# Patient Record
Sex: Female | Born: 2000
Health system: Southern US, Community
[De-identification: ages and names within clinical notes are randomized; demographics above are authoritative.]

## PROBLEM LIST (undated history)

## (undated) DIAGNOSIS — H7290 Unspecified perforation of tympanic membrane, unspecified ear: Secondary | ICD-10-CM

## (undated) DIAGNOSIS — Z8709 Personal history of other diseases of the respiratory system: Secondary | ICD-10-CM

## (undated) DIAGNOSIS — J302 Other seasonal allergic rhinitis: Secondary | ICD-10-CM

## (undated) HISTORY — PX: TONSILLECTOMY: SUR1361

## (undated) HISTORY — PX: ADENOIDECTOMY: SUR15

## (undated) HISTORY — PX: TYMPANOSTOMY TUBE PLACEMENT: SHX32

---
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
## 2020-01-17 DIAGNOSIS — W540XXA Bitten by dog, initial encounter: Secondary | ICD-10-CM | POA: Diagnosis not present

## 2020-01-17 DIAGNOSIS — S61451A Open bite of right hand, initial encounter: Secondary | ICD-10-CM | POA: Diagnosis not present

## 2020-03-02 DIAGNOSIS — J0100 Acute maxillary sinusitis, unspecified: Secondary | ICD-10-CM | POA: Diagnosis not present

## 2020-03-24 DIAGNOSIS — J330 Polyp of nasal cavity: Secondary | ICD-10-CM | POA: Diagnosis not present

## 2020-03-24 DIAGNOSIS — J309 Allergic rhinitis, unspecified: Secondary | ICD-10-CM | POA: Diagnosis not present

## 2020-04-21 DIAGNOSIS — J3081 Allergic rhinitis due to animal (cat) (dog) hair and dander: Secondary | ICD-10-CM | POA: Diagnosis not present

## 2020-04-21 DIAGNOSIS — J338 Other polyp of sinus: Secondary | ICD-10-CM | POA: Diagnosis not present

## 2020-04-21 DIAGNOSIS — J3089 Other allergic rhinitis: Secondary | ICD-10-CM | POA: Diagnosis not present

## 2020-04-21 DIAGNOSIS — J301 Allergic rhinitis due to pollen: Secondary | ICD-10-CM | POA: Diagnosis not present

## 2020-04-21 MED FILL — MONTELUKAST SOD 10 MG TAB: 10 | 30 days supply | Qty: 30 | Fill #0

## 2020-09-11 ENCOUNTER — Institutional Professional Consult (permissible substitution): Payer: 59 | Admitting: Plastic Surgery

## 2020-11-19 DIAGNOSIS — J3089 Other allergic rhinitis: Secondary | ICD-10-CM | POA: Diagnosis not present

## 2020-11-19 DIAGNOSIS — J301 Allergic rhinitis due to pollen: Secondary | ICD-10-CM | POA: Diagnosis not present

## 2020-11-19 DIAGNOSIS — J338 Other polyp of sinus: Secondary | ICD-10-CM | POA: Diagnosis not present

## 2020-11-19 DIAGNOSIS — J3081 Allergic rhinitis due to animal (cat) (dog) hair and dander: Secondary | ICD-10-CM | POA: Diagnosis not present

## 2021-09-14 ENCOUNTER — Encounter: Payer: Self-pay | Admitting: Family Medicine

## 2021-09-14 ENCOUNTER — Ambulatory Visit (INDEPENDENT_AMBULATORY_CARE_PROVIDER_SITE_OTHER): Payer: No Typology Code available for payment source | Admitting: Family Medicine

## 2021-09-14 VITALS — BP 130/71 | HR 101 | Ht 63.0 in | Wt 164.8 lb

## 2021-09-14 DIAGNOSIS — F419 Anxiety disorder, unspecified: Secondary | ICD-10-CM | POA: Diagnosis not present

## 2021-09-14 DIAGNOSIS — F32A Depression, unspecified: Secondary | ICD-10-CM | POA: Diagnosis not present

## 2021-09-14 MED ORDER — HYDROXYZINE PAMOATE 25 MG PO CAPS
25.0000 mg | ORAL_CAPSULE | Freq: Three times a day (TID) | ORAL | 0 refills | Status: DC | PRN
  Filled 2021-09-16: qty 30, 10d supply, fill #0

## 2021-09-14 MED ORDER — SERTRALINE HCL 50 MG PO TABS
50.0000 mg | ORAL_TABLET | Freq: Every day | ORAL | 3 refills | Status: DC
  Filled 2021-09-16: qty 30, 30d supply, fill #0
  Filled 2021-10-16: qty 30, 30d supply, fill #1

## 2021-09-14 NOTE — Progress Notes (Signed)
? ?______________________________________________________________________ ? ?HPI ?Annette Baker is a 21 y.o. female presenting to [facility] today to establish care. She is working in [employer] and in school for marketing at [employer].  ? ?Patient Care Team: ?Clayborne Dana, NP as PCP - General (Family Medicine) ? ?Health Maintenance  ?Topic Date Due  ? HPV Vaccine (1 - 2-dose series) Never done  ? HIV Screening  Never done  ? Hepatitis C Screening: USPSTF Recommendation to screen - Ages 46-79 yo.  Never done  ? COVID-19 Vaccine (4 - Booster for Pfizer series) 06/16/2020  ? Flu Shot  Never done  ? Pap Smear  08/04/2021  ? Pap Smear  08/04/2021  ? Tetanus Vaccine  02/21/2022  ? ? ? ?Concerns today: ?Depression/Anxiety - has been dealing with it for awhile, never addressed it with providers in the past, wants to address now. No SI/HI or suicide attempts since high school, several years ago. Unsure of any triggers initially, but brother died a few years ago (special needs) and that has worsened anxiety/depression. Never used medications, counseling. Very rare panic/anxiety attacks, otherwise just baseline anxiety/depression. Feels like she has a good support system.  ? ?Patient Active Problem List  ? Diagnosis Date Noted  ? Tympanic membrane central perforation 10/13/2012  ? Otitis media 08/15/2012  ? Asthma 05/21/2012  ? ? ?PHQ9 Today: ?Depression screen [facility] 2/9 09/14/2021  ?Decreased Interest 2  ?Down, Depressed, Hopeless 2  ?PHQ - 2 Score 4  ?Altered sleeping 3  ?Tired, decreased energy 3  ?Change in appetite 3  ?Feeling bad or failure about yourself  2  ?Trouble concentrating 3  ?Moving slowly or fidgety/restless 3  ?Suicidal thoughts 0  ?PHQ-9 Score 21  ?Difficult doing work/chores Somewhat difficult  ? ?GAD7 Today: ?GAD 7 : Generalized Anxiety Score 09/14/2021  ?Nervous, Anxious, on Edge 3  ?Control/stop worrying 2  ?Worry too much - different things 2  ?Trouble relaxing 2   ?Restless 3  ?Easily annoyed or irritable 3  ?Afraid - awful might happen 2  ?Total GAD 7 Score 17  ?Anxiety Difficulty Somewhat difficult  ? ?______________________________________________________________________ ?PMH ?Past Medical History:  ?Diagnosis Date  ? History of asthma   ? no current med.  ? Seasonal allergies   ? Tympanic membrane perforation 09/2014  ? left  ? ? ?ROS ?All review of systems negative except what is listed in the HPI ? ? ?Physical Exam ?Vitals reviewed.  ?Constitutional:   ?   Appearance: Normal appearance.  ?HENT:  ?   Head: Normocephalic and atraumatic.  ?Cardiovascular:  ?   Rate and Rhythm: Normal rate and regular rhythm.  ?Pulmonary:  ?   Effort: Pulmonary effort is normal.  ?   Breath sounds: Normal breath sounds.  ?Skin: ?   General: Skin is warm and dry.  ?Neurological:  ?   General: No focal deficit present.  ?   Mental Status: She is alert and oriented to person, place, and time. Mental status is at baseline.  ?Psychiatric:     ?   Mood and Affect: Mood normal.     ?   Behavior: Behavior normal.     ?   Thought Content: Thought content normal.     ?   Judgment: Judgment normal.  ? ?______________________________________________________________________ ?ASSESSMENT AND PLAN ? ?1. Anxiety and depression ?Elevated PHQ9/GAD7 as above. Discussed options with patient. She is interested in both medication and counseling - referral placed. Starting Zoloft daily and  PRN hydroxyzine - thorough discussion and education added to AVS. NO SI/HI. Patient aware of signs/symptoms requiring further/urgent evaluation.  ?- sertraline (ZOLOFT) 50 MG tablet; Take 1 tablet (50 mg total) by mouth daily.  Dispense: 30 tablet; Refill: 3 ?- hydrOXYzine (VISTARIL) 25 MG capsule; Take 1 capsule (25 mg total) by mouth every 8 (eight) hours as needed.  Dispense: 30 capsule; Refill: 0 ?- Ambulatory referral to Behavioral Health ? ? ?Establish care ?Education provided today during visit and on AVS for patient to  review at home.  ?Diet and Exercise recommendations provided.  ?Current diagnoses and recommendations discussed. ?HM recommendations reviewed with recommendations.  ? ? ?Outpatient Encounter Medications as of 09/14/2021  ?Medication Sig  ? hydrOXYzine (VISTARIL) 25 MG capsule Take 1 capsule (25 mg total) by mouth every 8 (eight) hours as needed.  ? sertraline (ZOLOFT) 50 MG tablet Take 1 tablet (50 mg total) by mouth daily.  ? ?No facility-administered encounter medications on file as of 09/14/2021.  ? ? ?Return in about 6 weeks (around 10/26/2021) for mood f/u, physical (+pap) at your convenience . ? ? ? ?Lollie Marrow Reola Calkins, DNP, FNP-C ? ? ?

## 2021-09-14 NOTE — Patient Instructions (Signed)
Taking the medicine as directed and not missing any doses is one of the best things you can do to treat your anxiety/depression.  Here are some things to keep in mind: Side effects (stomach upset, some increased anxiety) may happen before you notice a benefit.  These side effects typically go away over time. Changes to your dose of medicine or a change in medication all together is sometimes necessary Many people will notice an improvement within two weeks but the full effect of the medication can take up to 4-6 weeks Stopping the medication when you start feeling better often results in a return of symptoms. Most people need to be on medication at least 6-12 months If you start having thoughts of hurting yourself or others after starting this medicine, please call me immediately.     Thank you for choosing [facility] at [facility] for your Primary Care needs. I am excited for the opportunity to partner with you to meet your health care goals. It was a pleasure meeting you today!  Information on diet, exercise, and health maintenance recommendations are listed below. This is information to help you be sure you are on track for optimal health and monitoring.   Please look over this and let us know if you have any questions or if you have completed any of the health maintenance outside of [place] so that we can be sure your records are up to date.  ___________________________________________________________  MyChart:  For all urgent or time sensitive needs we ask that you please call the office to avoid delays. Our number is [phone]. MyChart is not constantly monitored and due to the large volume of messages a day, replies may take up to 72 business hours.  MyChart Policy: MyChart allows for you to see your visit notes, after visit summary, provider recommendations, lab and tests results, make an appointment, request refills, and contact your provider or the office  for non-urgent questions or concerns. Providers are seeing patients during normal business hours and do not have built in time to review MyChart messages.  We ask that you allow a minimum of 3 business days for responses to [employer]. For this reason, please do not send urgent requests through [place]. Please call the office at [phone]. New and ongoing conditions may require a visit. We have virtual and in-person visits available for your convenience.  Complex MyChart concerns may require a visit. Your provider may request you schedule a virtual or in-person visit to ensure we are providing the best care possible. MyChart messages sent after 11:00 AM on Friday will not be received by the provider until Monday morning.    Lab and Test Results: You will receive your lab and test results on MyChart as soon as they are completed and results have been sent by the lab or testing facility. Due to this service, you will receive your results BEFORE your provider.  I review lab and test results each morning prior to seeing patients. Some results require collaboration with other providers to ensure you are receiving the most appropriate care. For this reason, we ask that you please allow a minimum of 3-5 business days from the time that ALL results have been received for your provider to receive and review lab and test results and contact you about these.  Most lab and test result comments from the provider will be sent through [place]. Your provider may recommend changes to the plan of care, follow-up visits, repeat  testing, ask questions, or request an office visit to discuss these results. You may reply directly to this message or call the office to provide information for the provider or set up an appointment. In some instances, you will be called with test results and recommendations. Please let us know if this is preferred and we will make note of this in your chart to provide this for you.    If  you have not heard a response to your lab or test results in 5 business days from all results returning to [place], please call the office to let us know. We ask that you please avoid calling prior to this time unless there is an emergent concern. Due to high call volumes, this can delay the resulting process.  After Hours: For all non-emergency after hours needs, please call the office at [phone] and select the option to reach the on-call  service. On-call services are shared between multiple [place] offices and therefore it will not be possible to speak directly with your provider. On-call providers may provide medical advice and recommendations, but are unable to provide refills for maintenance medications.  For all emergency or urgent medical needs after normal business hours, we recommend that you seek care at the closest Urgent Care or Emergency Department to ensure appropriate treatment in a timely manner.  [facility] [place] [facility] has a 24 hour emergency room located on the ground floor for your convenience.   Urgent Concerns During the Business Day Providers are seeing patients from 8AM to [place] with a busy schedule and are most often not able to respond to non-urgent calls until the end of the day or the next business day. If you should have URGENT concerns during the day, please call and speak to the nurse or schedule a same day appointment so that we can address your concern without delay.   Thank you, again, for choosing me as your health care partner. I appreciate your trust and look forward to learning more about you.   Purcell Nails Olevia Bowens, DNP, FNP-C  ___________________________________________________________  Health Maintenance Recommendations Screening Testing Mammogram Every 1-2 years based on history and risk factors Starting at age 41 Pap Smear Ages 21-39 every 3 years Ages 28-65 every 5 years with HPV testing More frequent testing may be required based on  results and history Colon Cancer Screening Every 1-10 years based on test performed, risk factors, and history Starting at age 32 Bone Density Screening Every 2-10 years based on history Starting at age 39 for women Recommendations for men differ based on medication usage, history, and risk factors AAA Screening One time ultrasound Men 54-71 years old who have ever smoked Lung Cancer Screening Low Dose Lung CT every 12 months Age 33-80 years with a 20 pack-year smoking history who still smoke or who have quit within the last 15 years  Screening Labs Routine  Labs: Complete Blood Count (CBC), Complete Metabolic Panel (CMP), Cholesterol (Lipid Panel) Every 6-12 months based on history and medications May be recommended more frequently based on current conditions or previous results Hemoglobin A1c Lab Every 3-12 months based on history and previous results Starting at age 39 or earlier with diagnosis of diabetes, high cholesterol, BMI >26, and/or risk factors Frequent monitoring for patients with diabetes to ensure blood sugar control Thyroid Panel (TSH w/ T3 & T4) Every 6 months based on history, symptoms, and risk factors May be repeated more often if on medication HIV One time testing for all  patients 59 and older May be repeated more frequently for patients with increased risk factors or exposure Hepatitis C One time testing for all patients 70 and older May be repeated more frequently for patients with increased risk factors or exposure Gonorrhea, Chlamydia Every 12 months for all sexually active persons 13-24 years Additional monitoring may be recommended for those who are considered high risk or who have symptoms PSA Men 30-32 years old with risk factors Additional screening may be recommended from age 70-69 based on risk factors, symptoms, and history  Vaccine Recommendations Tetanus Booster All adults every 10 years Flu Vaccine All patients 6 months and older every  year COVID Vaccine All patients 12 years and older Initial dosing with booster May recommend additional booster based on age and health history HPV Vaccine 2 doses all patients age 43-26 Dosing may be considered for patients over 26 Shingles Vaccine (Shingrix) 2 doses all adults 71 years and older Pneumonia (Pneumovax 41) All adults 16 years and older May recommend earlier dosing based on health history Pneumonia (Prevnar 71) All adults 54 years and older Dosed 1 year after Pneumovax 23 Pneumonia (Prevnar 58) All adults 20 years and older (adults [id number] with certain conditions or risk factors) 1 dose  For those who have no received Prevnar 13 vaccine previously   Additional Screening, Testing, and Vaccinations may be recommended on an individualized basis based on family history, health history, risk factors, and/or exposure.  __________________________________________________________  Diet Recommendations for All Patients  I recommend that all patients maintain a diet low in saturated fats, carbohydrates, and cholesterol. While this can be challenging at first, it is not impossible and small changes can make big differences.  Things to try: Decreasing the amount of soda, sweet tea, and/or juice to one or less per day and replace with water While water is always the first choice, if you do not like water you may consider adding a water additive without sugar to improve the taste other sugar free drinks Replace potatoes with a brightly colored vegetable at dinner Use healthy oils, such as canola oil or olive oil, instead of butter or hard margarine Limit your bread intake to two pieces or less a day Replace regular pasta with low carb pasta options Bake, broil, or grill foods instead of frying Monitor portion sizes  Eat smaller, more frequent meals throughout the day instead of large meals  An important thing to remember is, if you love foods that are not great for your health,  you don't have to give them up completely. Instead, allow these foods to be a reward when you have done well. Allowing yourself to still have special treats every once in a while is a nice way to tell yourself thank you for working hard to keep yourself healthy.   Also remember that every day is a new day. If you have a bad day and "fall off the wagon", you can still climb right back up and keep moving along on your journey!  We have resources available to help you!  Some websites that may be helpful include: www.http://carter.biz/  Www.VeryWellFit.com _____________________________________________________________  Activity Recommendations for All Patients  I recommend that all adults get at least 20 minutes of moderate physical activity that elevates your heart rate at least 5 days out of the week.  Some examples include: Walking or jogging at a pace that allows you to carry on a conversation Cycling (stationary bike or outdoors) Water aerobics Yoga Weight lifting Dancing If physical  limitations prevent you from putting stress on your joints, exercise in a pool or seated in a chair are excellent options.  Do determine your MAXIMUM heart rate for activity: 220 - YOUR AGE = MAX Heart Rate   Remember! Do not push yourself too hard.  Start slowly and build up your pace, speed, weight, time in exercise, etc.  Allow your body to rest between exercise and get good sleep. You will need more water than normal when you are exerting yourself. Do not wait until you are thirsty to drink. Drink with a purpose of getting in at least 8, 8 ounce glasses of water a day plus more depending on how much you exercise and sweat.    If you begin to develop dizziness, chest pain, abdominal pain, jaw pain, shortness of breath, headache, vision changes, lightheadedness, or other concerning symptoms, stop the activity and allow your body to rest. If your symptoms are severe, seek emergency evaluation immediately. If  your symptoms are concerning, but not severe, please let us know so that we can recommend further evaluation.

## 2021-09-16 ENCOUNTER — Telehealth: Payer: No Typology Code available for payment source | Admitting: Physician Assistant

## 2021-09-16 ENCOUNTER — Other Ambulatory Visit (HOSPITAL_COMMUNITY): Payer: Self-pay

## 2021-09-16 DIAGNOSIS — J45901 Unspecified asthma with (acute) exacerbation: Secondary | ICD-10-CM | POA: Diagnosis not present

## 2021-09-16 MED ORDER — ALBUTEROL SULFATE HFA 108 (90 BASE) MCG/ACT IN AERS
2.0000 | INHALATION_SPRAY | Freq: Four times a day (QID) | RESPIRATORY_TRACT | 0 refills | Status: DC | PRN
  Filled 2021-09-16: qty 8.5, 25d supply, fill #0

## 2021-09-16 MED ORDER — PREDNISONE 50 MG PO TABS
50.0000 mg | ORAL_TABLET | Freq: Every day | ORAL | 0 refills | Status: AC
  Filled 2021-09-16: qty 5, 5d supply, fill #0

## 2021-09-16 NOTE — Progress Notes (Signed)
Visit for Asthma ? ?Based on what you have shared with me, it looks like you may have a flare up of your asthma.  Asthma is a chronic (ongoing) lung disease which results in airway obstruction, inflammation and hyper-responsiveness.  ? ?Asthma symptoms vary from person to person, with common symptoms including nighttime awakening and decreased ability to participate in normal activities as a result of shortness of breath. It is often triggered by changes in weather, changes in the season, changes in air temperature, or inside (home, school, daycare or work) allergens such as animal dander, mold, mildew, woodstoves or cockroaches.   It can also be triggered by hormonal changes, extreme emotion, physical exertion or an upper respiratory tract illness.    ? ?It is important to identify the trigger, and then eliminate or avoid the trigger if possible.  ? ?If you have been prescribed medications to be taken on a regular basis, it is important to follow the asthma action plan and to follow guidelines to adjust medication in response to increasing symptoms of decreased peak expiratory flow rate ? ?Treatment: ?I have prescribed: Albuterol (Proventil HFA; Ventolin HFA) 108 (90 Base) MCG/ACT Inhaler 2 puffs into the lungs every six hours as needed for wheezing or shortness of breath ? ?I have also prescribed prednisone to help with your symptoms.  ? ?If your shortness of breath is severe then you need to been seen in person.  ? ?HOME CARE ?Only take medications as instructed by your medical team. ?Consider wearing a mask or scarf to improve breathing air temperature have been shown to decrease irritation and decrease exacerbations ?Get rest. ?Taking a steamy shower or using a humidifier may help nasal congestion sand ease sore throat pain. You can place a towel over your head and breathe in the steam from hot water  coming from a faucet. ?Using a saline nasal spray works much the same way.  ?Cough drops, hare candies and sore throat lozenges may ease your cough.  ?Avoid close contacts especially the very you and the elderly ?Cover your mouth if you cough or sneeze ?Always remember to wash your hands.  ? ? ?GET HELP RIGHT AWAY IF: ?You develop worsening symptoms; breathlessness at rest, drowsy, confused or agitated, unable to speak in full sentences ?You have coughing fits ?You develop a severe headache or visual changes ?You develop shortness of breath, difficulty breathing or start having chest pain ?Your symptoms persist after you have completed your treatment plan ?If your symptoms do not improve within 10 days ? ?MAKE SURE YOU ?Understand these instructions. ?Will watch your condition. ?Will get help right away if you are not doing well or get worse.  ? ?Your e-visit answers were reviewed by a board certified advanced clinical practitioner to complete your personal care plan, Depending upon the condition, your plan could have included both over the counter or prescription medications.  ? ?Please review your pharmacy choice. ?Your safety is important to [place]. If you have drug allergies check your prescription carefully. ? ?You can use MyChart to ask questions about today's visit, request a non-urgent  ?call back, or ask for a work or school excuse for 24 hours related to this e-Visit. If it has been greater than 24 hours you will need to follow up with your provider, or enter a new e-Visit to address those concerns.  ? ?You will get an e-mail in the next two days asking about your experience. I hope that your e-visit has been valuable and will  speed your recovery. Thank you for using e-visits. ? ?Approximately 5 minutes was spent documenting and reviewing patient's chart. ? ?

## 2021-10-16 ENCOUNTER — Other Ambulatory Visit (HOSPITAL_COMMUNITY): Payer: Self-pay

## 2021-10-26 ENCOUNTER — Ambulatory Visit (INDEPENDENT_AMBULATORY_CARE_PROVIDER_SITE_OTHER): Payer: No Typology Code available for payment source | Admitting: Family Medicine

## 2021-10-26 ENCOUNTER — Ambulatory Visit: Payer: No Typology Code available for payment source | Admitting: Psychology

## 2021-10-26 ENCOUNTER — Encounter: Payer: Self-pay | Admitting: Family Medicine

## 2021-10-26 DIAGNOSIS — F419 Anxiety disorder, unspecified: Secondary | ICD-10-CM

## 2021-10-26 DIAGNOSIS — F32A Depression, unspecified: Secondary | ICD-10-CM | POA: Diagnosis not present

## 2021-10-26 MED ORDER — SERTRALINE HCL 50 MG PO TABS: 75.0000 mg | ORAL_TABLET | Freq: Every day | ORAL | 3 refills | Status: DC

## 2021-10-26 NOTE — Patient Instructions (Addendum)
Glad you are doing better! ?We can try increasing your Zoloft to 75 mg (1.5 tablets) daily and seeing if this will help you get a little bit more steady state. With dose changes, you can experience the side effects we previously discussed. Follow-up if you have any concerning symptoms or seek immediate care if you have any thoughts of hurting yourself or others.  ?6 week follow-up.  ?

## 2021-10-26 NOTE — Progress Notes (Addendum)
? ?Established Patient Office Visit ? ?Subjective   ?Patient ID: Annette Baker, female    DOB: January 27, 2001  Age: 21 y.o. MRN: [id number] ? ?CC: mood f/u  ? ? ?HPI ? ?Anxiety/depression: ?- At last visit on 09/14/21 (establish care visit), patient reported anxiety/depression that had never been treated. She was educated and started on Zoloft 50 mg daily and PRN hydroxyzine. Referral was placed for counseling.  ?- Today she reports she is doing really well overall. States she has noticed significant improvement since starting the Zoloft, but feels like there has been a plateau and she may benefit from a slightly higher dose. She has not had any panic attacks or needed the PRN hydroxyzine. She denies any SI/HI. Reports no side effects from the medication. She is working on scheduling her first counseling appointment.  ? ? ?  10/26/2021  ? 11:20 AM 09/14/2021  ?  4:08 PM  ?PHQ9 SCORE ONLY  ?PHQ-9 Total Score 12 21  ? ? ?  10/26/2021  ? 11:21 AM 09/14/2021  ?  4:10 PM  ?GAD 7 : Generalized Anxiety Score  ?Nervous, Anxious, on Edge 1 3  ?Control/stop worrying 1 2  ?Worry too much - different things 1 2  ?Trouble relaxing 1 2  ?Restless 3 3  ?Easily annoyed or irritable 1 3  ?Afraid - awful might happen 1 2  ?Total GAD 7 Score 9 17  ?Anxiety Difficulty Somewhat difficult Somewhat difficult  ? ? ? ? ? ?ROS ?All review of systems negative except what is listed in the HPI ? ?  ?Objective:  ?  ? ?BP 136/80   Pulse 97   Ht 5\' 3"  (1.6 m)   Wt 170 lb (77.1 kg)   BMI 30.11 kg/m?  ? ? ?Physical Exam ?Vitals reviewed.  ?Constitutional:   ?   Appearance: Normal appearance.  ?Cardiovascular:  ?   Rate and Rhythm: Normal rate and regular rhythm.  ?Pulmonary:  ?   Effort: Pulmonary effort is normal.  ?   Breath sounds: Normal breath sounds.  ?Skin: ?   General: Skin is warm and dry.  ?Neurological:  ?   General: No focal deficit present.  ?   Mental Status: She is alert and oriented to person, place, and time. Mental status is at  baseline.  ?Psychiatric:     ?   Mood and Affect: Mood normal.     ?   Behavior: Behavior normal.     ?   Thought Content: Thought content normal.     ?   Judgment: Judgment normal.  ? ? ? ?No results found for any visits on 10/26/21. ? ? ? ?The ASCVD Risk score (Arnett DK, et al., 2019) failed to calculate for the following reasons: ?  The 2019 ASCVD risk score is only valid for ages 55 to 44 ? ?  ?Assessment & Plan:  ? ?Problem List Items Addressed This Visit   ? ?  ? Other  ? Anxiety and depression  ?  Glad you are doing better! ?We can try increasing your Zoloft to 75 mg (1.5 tablets) daily and seeing if this will help you get a little bit more steady state. With dose changes, you can experience the side effects we previously discussed. Follow-up if you have any concerning symptoms or seek immediate care if you have any thoughts of hurting yourself or others.  ?6 week follow-up.  ?  ?  ? Relevant Medications  ? sertraline (ZOLOFT) 50 MG tablet  ? ? ?  Return in about 6 weeks (around 12/07/2021) for mood f/u (virtual okay).  ? ? ?Terrilyn Saver, NP ? ?

## 2021-10-26 NOTE — Assessment & Plan Note (Signed)
Glad you are doing better! ?We can try increasing your Zoloft to 75 mg (1.5 tablets) daily and seeing if this will help you get a little bit more steady state. With dose changes, you can experience the side effects we previously discussed. Follow-up if you have any concerning symptoms or seek immediate care if you have any thoughts of hurting yourself or others.  ?6 week follow-up.  ?

## 2021-10-29 ENCOUNTER — Telehealth: Payer: No Typology Code available for payment source | Admitting: Physician Assistant

## 2021-10-29 ENCOUNTER — Other Ambulatory Visit (HOSPITAL_COMMUNITY): Payer: Self-pay

## 2021-10-29 DIAGNOSIS — K529 Noninfective gastroenteritis and colitis, unspecified: Secondary | ICD-10-CM | POA: Diagnosis not present

## 2021-10-29 MED ORDER — ONDANSETRON 4 MG PO TBDP
4.0000 mg | ORAL_TABLET | Freq: Three times a day (TID) | ORAL | 0 refills | Status: DC | PRN
  Filled 2021-10-29: qty 20, 7d supply, fill #0

## 2021-10-29 NOTE — Progress Notes (Signed)
E-Visit for Vomiting  We are sorry that you are not feeling well. Here is how we plan to help!  Based on what you have shared with me it looks like you have a Virus that is irritating your GI tract.  Vomiting is the forceful emptying of a portion of the stomach's content through the mouth.  Although nausea and vomiting can make you feel miserable, it's important to remember that these are not diseases, but rather symptoms of an underlying illness.  When we treat short term symptoms, we always caution that any symptoms that persist should be fully evaluated in a medical office.  I have prescribed a medication that will help alleviate your symptoms and allow you to stay hydrated:  Zofran 4 mg 1 tablet every 8 hours as needed for nausea and vomiting  I have sent a work note to your MyChart.   HOME CARE: Drink clear liquids.  This is very important! Dehydration (the lack of fluid) can lead to a serious complication.  Start off with 1 tablespoon every 5 minutes for 8 hours. You may begin eating bland foods after 8 hours without vomiting.  Start with saltine crackers, white bread, rice, mashed potatoes, applesauce. After 48 hours on a bland diet, you may resume a normal diet. Try to go to sleep.  Sleep often empties the stomach and relieves the need to vomit.  GET HELP RIGHT AWAY IF:  Your symptoms do not improve or worsen within 2 days after treatment. You have a fever for over 3 days. You cannot keep down fluids after trying the medication.  MAKE SURE YOU:  Understand these instructions. Will watch your condition. Will get help right away if you are not doing well or get worse.   Thank you for choosing an e-visit.  Your e-visit answers were reviewed by a board certified advanced clinical practitioner to complete your personal care plan. Depending upon the condition, your plan could have included both over the counter or prescription medications.  Please review your pharmacy choice.  Make sure the pharmacy is open so you can pick up prescription now. If there is a problem, you may contact your provider through MyChart messaging and have the prescription routed to another pharmacy.  Your safety is important to us. If you have drug allergies check your prescription carefully.   For the next 24 hours you can use MyChart to ask questions about today's visit, request a non-urgent call back, or ask for a work or school excuse. You will get an email in the next two days asking about your experience. I hope that your e-visit has been valuable and will speed your recovery.  

## 2021-10-29 NOTE — Progress Notes (Signed)
I have spent 5 minutes in review of e-visit questionnaire, review and updating patient chart, medical decision making and response to patient.   Micky Sheller Cody Anavey Coombes, PA-C    

## 2021-11-06 ENCOUNTER — Other Ambulatory Visit (HOSPITAL_COMMUNITY): Payer: Self-pay

## 2021-11-09 ENCOUNTER — Other Ambulatory Visit: Payer: Self-pay | Admitting: Family Medicine

## 2021-11-09 ENCOUNTER — Other Ambulatory Visit (HOSPITAL_COMMUNITY): Payer: Self-pay

## 2021-11-09 DIAGNOSIS — F419 Anxiety disorder, unspecified: Secondary | ICD-10-CM

## 2021-11-10 ENCOUNTER — Other Ambulatory Visit (HOSPITAL_COMMUNITY): Payer: Self-pay

## 2021-11-10 ENCOUNTER — Telehealth: Payer: Self-pay | Admitting: Family Medicine

## 2021-11-10 ENCOUNTER — Other Ambulatory Visit: Payer: Self-pay | Admitting: *Deleted

## 2021-11-10 DIAGNOSIS — F419 Anxiety disorder, unspecified: Secondary | ICD-10-CM

## 2021-11-10 MED ORDER — SERTRALINE HCL 50 MG PO TABS
75.0000 mg | ORAL_TABLET | Freq: Every day | ORAL | 2 refills | Status: DC
  Filled 2021-11-10: qty 45, 30d supply, fill #0
  Filled 2021-12-10: qty 45, 30d supply, fill #1
  Filled 2022-01-09: qty 45, 30d supply, fill #2

## 2021-11-10 NOTE — Telephone Encounter (Signed)
Pt states she is needing a refill and pharmacy advised her to call our office.  ? ?Medication: sertraline (ZOLOFT) 50 MG tablet ? ?Has the patient contacted their pharmacy? Yes.   ? ? ?Preferred Pharmacy: [facility]  ? [address], [place] [address]  ?Phone:  [phone]  Fax:  [phone]  ?

## 2021-11-10 NOTE — Telephone Encounter (Signed)
Refill sent to pharmacy.   

## 2021-11-17 ENCOUNTER — Encounter: Payer: No Typology Code available for payment source | Admitting: Family Medicine

## 2021-11-21 ENCOUNTER — Other Ambulatory Visit: Payer: Self-pay | Admitting: Physician Assistant

## 2021-11-23 ENCOUNTER — Other Ambulatory Visit (HOSPITAL_COMMUNITY): Payer: Self-pay

## 2021-11-23 ENCOUNTER — Other Ambulatory Visit: Payer: Self-pay | Admitting: Physician Assistant

## 2021-11-24 ENCOUNTER — Other Ambulatory Visit (HOSPITAL_COMMUNITY): Payer: Self-pay

## 2021-11-24 ENCOUNTER — Encounter: Payer: No Typology Code available for payment source | Admitting: Family Medicine

## 2021-11-24 MED ORDER — ACETAMINOPHEN-CODEINE 300-30 MG PO TABS
ORAL_TABLET | ORAL | 0 refills | Status: DC
  Filled 2021-11-24: qty 20, 5d supply, fill #0

## 2021-12-04 ENCOUNTER — Other Ambulatory Visit (HOSPITAL_COMMUNITY): Payer: Self-pay

## 2021-12-08 ENCOUNTER — Ambulatory Visit: Payer: No Typology Code available for payment source | Admitting: Family Medicine

## 2021-12-11 ENCOUNTER — Other Ambulatory Visit (HOSPITAL_COMMUNITY): Payer: Self-pay

## 2021-12-29 ENCOUNTER — Other Ambulatory Visit (HOSPITAL_COMMUNITY): Payer: Self-pay

## 2022-01-09 ENCOUNTER — Other Ambulatory Visit (HOSPITAL_COMMUNITY): Payer: Self-pay

## 2022-02-05 ENCOUNTER — Other Ambulatory Visit (HOSPITAL_COMMUNITY): Payer: Self-pay

## 2022-02-05 ENCOUNTER — Other Ambulatory Visit: Payer: Self-pay | Admitting: Family Medicine

## 2022-02-05 DIAGNOSIS — F419 Anxiety disorder, unspecified: Secondary | ICD-10-CM

## 2022-02-05 MED ORDER — SERTRALINE HCL 50 MG PO TABS
75.0000 mg | ORAL_TABLET | Freq: Every day | ORAL | 0 refills | Status: DC
  Filled 2022-02-05: qty 45, 30d supply, fill #0

## 2022-03-05 ENCOUNTER — Other Ambulatory Visit (HOSPITAL_COMMUNITY): Payer: Self-pay

## 2022-03-05 ENCOUNTER — Other Ambulatory Visit: Payer: Self-pay | Admitting: Family Medicine

## 2022-03-05 DIAGNOSIS — F32A Depression, unspecified: Secondary | ICD-10-CM

## 2022-03-05 MED ORDER — HYDROXYZINE PAMOATE 25 MG PO CAPS
25.0000 mg | ORAL_CAPSULE | Freq: Three times a day (TID) | ORAL | 0 refills | Status: DC | PRN
  Filled 2022-03-05: qty 30, 10d supply, fill #0

## 2022-03-15 ENCOUNTER — Other Ambulatory Visit: Payer: Self-pay | Admitting: Family Medicine

## 2022-03-15 DIAGNOSIS — F419 Anxiety disorder, unspecified: Secondary | ICD-10-CM

## 2022-03-16 ENCOUNTER — Other Ambulatory Visit: Payer: Self-pay | Admitting: Family Medicine

## 2022-03-16 ENCOUNTER — Telehealth: Payer: Self-pay | Admitting: Family Medicine

## 2022-03-16 ENCOUNTER — Other Ambulatory Visit: Payer: Self-pay | Admitting: *Deleted

## 2022-03-16 ENCOUNTER — Other Ambulatory Visit (HOSPITAL_COMMUNITY): Payer: Self-pay

## 2022-03-16 DIAGNOSIS — F32A Depression, unspecified: Secondary | ICD-10-CM

## 2022-03-16 MED ORDER — SERTRALINE HCL 50 MG PO TABS
75.0000 mg | ORAL_TABLET | Freq: Every day | ORAL | 0 refills | Status: DC
  Filled 2022-03-16: qty 45, 30d supply, fill #0

## 2022-03-16 NOTE — Telephone Encounter (Signed)
30 day supply sent to pharmacy and scheduled pt with [facility] for follow up.

## 2022-03-16 NOTE — Telephone Encounter (Signed)
Medication: sertraline (ZOLOFT) 50 MG tablet  Has the patient contacted their pharmacy? No.   Preferred Pharmacy  [facility]   [address], [place] [address]  Phone:  [phone]  Fax:  [phone]

## 2022-03-17 ENCOUNTER — Other Ambulatory Visit (HOSPITAL_COMMUNITY): Payer: Self-pay

## 2022-04-01 ENCOUNTER — Telehealth: Payer: No Typology Code available for payment source | Admitting: Family Medicine

## 2022-04-01 ENCOUNTER — Other Ambulatory Visit (HOSPITAL_COMMUNITY): Payer: Self-pay

## 2022-04-01 DIAGNOSIS — R21 Rash and other nonspecific skin eruption: Secondary | ICD-10-CM

## 2022-04-01 MED ORDER — NYSTATIN-TRIAMCINOLONE 100000-0.1 UNIT/GM-% EX OINT
1.0000 | TOPICAL_OINTMENT | Freq: Two times a day (BID) | CUTANEOUS | 0 refills | Status: DC
  Filled 2022-04-01: qty 30, 15d supply, fill #0

## 2022-04-01 NOTE — Progress Notes (Signed)
E Visit for Rash  We are sorry that you are not feeling well. Here is how we plan to help!  Mycolog cream twice daily.  This appears like a heat rash given the area. The cream will cover contact rash and fungal- but seems more like heat.  HOME CARE:  Take cool showers and avoid direct sunlight. Apply cool compress or wet dressings. Take a bath in an oatmeal bath.  Sprinkle content of one Aveeno packet under running faucet with comfortably warm water.  Bathe for 15-20 minutes, 1-2 times daily.  Pat dry with a towel. Do not rub the rash. Use hydrocortisone cream. Take an antihistamine like Benadryl for widespread rashes that itch.  The adult dose of Benadryl is 25-50 mg by mouth 4 times daily. Caution:  This type of medication may cause sleepiness.  Do not drink alcohol, drive, or operate dangerous machinery while taking antihistamines.  Do not take these medications if you have prostate enlargement.  Read package instructions thoroughly on all medications that you take.  GET HELP RIGHT AWAY IF:  Symptoms don't go away after treatment. Severe itching that persists. If you rash spreads or swells. If you rash begins to smell. If it blisters and opens or develops a yellow-brown crust. You develop a fever. You have a sore throat. You become short of breath.  MAKE SURE YOU:  Understand these instructions. Will watch your condition. Will get help right away if you are not doing well or get worse.  Thank you for choosing an e-visit.  Your e-visit answers were reviewed by a board certified advanced clinical practitioner to complete your personal care plan. Depending upon the condition, your plan could have included both over the counter or prescription medications.  Please review your pharmacy choice. Make sure the pharmacy is open so you can pick up prescription now. If there is a problem, you may contact your provider through [employer] and have the prescription routed to  another pharmacy.  Your safety is important to [place]. If you have drug allergies check your prescription carefully.   For the next 24 hours you can use MyChart to ask questions about today's visit, request a non-urgent call back, or ask for a work or school excuse. You will get an email in the next two days asking about your experience. I hope that your e-visit has been valuable and will speed your recovery.  I provided 5 minutes of non face-to-face time during this encounter for chart review, medication and order placement, as well as and documentation.

## 2022-04-02 ENCOUNTER — Other Ambulatory Visit (HOSPITAL_COMMUNITY): Payer: Self-pay

## 2022-04-05 ENCOUNTER — Ambulatory Visit (INDEPENDENT_AMBULATORY_CARE_PROVIDER_SITE_OTHER): Payer: No Typology Code available for payment source | Admitting: Family

## 2022-04-05 ENCOUNTER — Encounter: Payer: Self-pay | Admitting: Family

## 2022-04-05 ENCOUNTER — Other Ambulatory Visit (HOSPITAL_COMMUNITY): Payer: Self-pay

## 2022-04-05 DIAGNOSIS — F419 Anxiety disorder, unspecified: Secondary | ICD-10-CM | POA: Diagnosis not present

## 2022-04-05 DIAGNOSIS — F32A Depression, unspecified: Secondary | ICD-10-CM | POA: Diagnosis not present

## 2022-04-05 MED ORDER — SERTRALINE HCL 50 MG PO TABS
75.0000 mg | ORAL_TABLET | Freq: Every day | ORAL | 1 refills | Status: DC
  Filled 2022-04-05 – 2022-04-19 (×2): qty 135, 90d supply, fill #0
  Filled 2022-08-10: qty 135, 90d supply, fill #1

## 2022-04-05 NOTE — Progress Notes (Signed)
Subjective:   By signing my name below, I, Shehryar Baig, attest that this documentation has been prepared under the direction and in the presence of Sandford Craze, NP. 04/05/2022    Patient ID: Annette Baker, female    DOB: Nov 30, 2000, 21 y.o.   MRN: [id number]  Chief Complaint  Patient presents with   Medical Management of Chronic Issues    Med follow up     HPI Patient is in today for a follow up visit.   Anxiety- She has been taking 75 mg sertraline since April, 2023 for her anxiety and reports doing well while taking it. She is requesting a refill on it as well. She also takes 25 mg hydroxyzine when her anxiety worsens but finds no change in her symptoms while taking it.   Rash- She was applying nystatin cream to a rash on her back and reports her rash disappeared. Since it left she stopped applying the nystatin cream.   Pap smear- She is willing to start completing pap smears with her PCP.    Health Maintenance Due  Topic Date Due   HPV VACCINES (1 - 2-dose series) Never done   HIV Screening  Never done   Hepatitis C Screening  Never done   COVID-19 Vaccine (4 - Pfizer series) 06/16/2020   PAP-Cervical Cytology Screening  Never done   PAP SMEAR-Modifier  Never done   TETANUS/TDAP  02/21/2022    Past Medical History:  Diagnosis Date   History of asthma    no current med.   Seasonal allergies    Tympanic membrane perforation 09/2014   left    Past Surgical History:  Procedure Laterality Date   ADENOIDECTOMY     MYRINGOPLASTY W/ PAPER PATCH Left 10/13/2012   Procedure: REMOVAL OF LEFT EAR TUBE AND PAPER PATCH MYRINGOPLASTY;  Surgeon: Osborn Coho, MD;  Location: [facility];  Service: ENT;  Laterality: Left;   TONSILLECTOMY     TYMPANOPLASTY Left 10/10/2014   Procedure: LEFT TYMPANOPLASTY;  Surgeon: Serena Colonel, MD;  Location: [facility];  Service: ENT;  Laterality: Left;   TYMPANOSTOMY TUBE PLACEMENT      Family History   Problem Relation Age of Onset   Hyperlipidemia Mother    Hypertension Mother    Hypertension Father    COPD Maternal Grandmother    Arthritis Maternal Grandmother    Hypertension Maternal Grandfather    Hyperlipidemia Maternal Grandfather    Heart disease Paternal Grandmother    Hyperlipidemia Paternal Grandfather    Heart disease Paternal Grandfather        MI    Social History   Socioeconomic History   Marital status: Single    Spouse name: Not on file   Number of children: Not on file   Years of education: Not on file   Highest education level: Not on file  Occupational History   Not on file  Tobacco Use   Smoking status: Every Day    Types: Cigarettes   Smokeless tobacco: Never  Substance and Sexual Activity   Alcohol use: Yes   Drug use: Yes   Sexual activity: Yes  Other Topics Concern   Not on file  Social History Narrative   Not on file   Social Determinants of Health   Financial Resource Strain: Not on file  Food Insecurity: Not on file  Transportation Needs: Not on file  Physical Activity: Not on file  Stress: Not on file  Social Connections: Not on  file  Intimate Partner Violence: Not on file    Outpatient Medications Prior to Visit  Medication Sig Dispense Refill   hydrOXYzine (VISTARIL) 25 MG capsule Take 1 capsule  by mouth every 8  hours as needed. 30 capsule 0   nystatin-triamcinolone ointment (MYCOLOG) Apply 1 Application topically 2 (two) times daily. 30 g 0   sertraline (ZOLOFT) 50 MG tablet Take 1.5 tablets (75 mg total) by mouth daily. Need office visit. 45 tablet 0   acetaminophen-codeine (TYLENOL #3) 300-30 MG tablet Take 1 tablet by mouth every 6 hours as needed for pain. 20 tablet 0   albuterol (VENTOLIN HFA) 108 (90 Base) MCG/ACT inhaler Inhale 2 puffs into the lungs every 6 hours as needed for wheezing or shortness of breath. 8.5 g 0   ondansetron (ZOFRAN-ODT) 4 MG disintegrating tablet Allow 1 tablet to dissolve by mouth every 8   hours as needed for nausea or vomiting. 20 tablet 0   No facility-administered medications prior to visit.    No Known Allergies  ROS See HPI    Objective:    Physical Exam Constitutional:      General: She is not in acute distress.    Appearance: Normal appearance. She is not ill-appearing.  HENT:     Head: Normocephalic and atraumatic.     Right Ear: External ear normal.     Left Ear: External ear normal.  Eyes:     Extraocular Movements: Extraocular movements intact.     Pupils: Pupils are equal, round, and reactive to light.  Cardiovascular:     Rate and Rhythm: Normal rate and regular rhythm.     Heart sounds: Normal heart sounds. No murmur heard.    No gallop.  Pulmonary:     Effort: Pulmonary effort is normal. No respiratory distress.     Breath sounds: Normal breath sounds. No wheezing or rales.  Skin:    General: Skin is warm and dry.  Neurological:     Mental Status: She is alert and oriented to person, place, and time.  Psychiatric:        Judgment: Judgment normal.     BP 122/60   Pulse 95   Temp 98 F (36.7 C) (Oral)   Ht 5\' 4"  (1.626 m)   Wt 170 lb (77.1 kg)   LMP 03/15/2022   SpO2 99%   BMI 29.18 kg/m  Wt Readings from Last 3 Encounters:  04/05/22 170 lb (77.1 kg)  10/26/21 170 lb (77.1 kg)  09/14/21 164 lb 12.8 oz (74.8 kg)       Assessment & Plan:   Problem List Items Addressed This Visit       Unprioritized   Anxiety and depression    Overall stable on zoloft 75mg  once daily. Keeps hydroxyzine on hand for anxiety PRN.  Continue same.       Relevant Medications   sertraline (ZOLOFT) 50 MG tablet     Meds ordered this encounter  Medications   sertraline (ZOLOFT) 50 MG tablet    Sig: Take 1.5 tablets (75 mg total) by mouth daily. Need office visit.    Dispense:  135 tablet    Refill:  1    Order Specific Question:   Supervising Provider    Answer:   Penni Homans A [[id number]]    I, Nance Pear, NP, personally  preformed the services described in this documentation.  All medical record entries made by the scribe were at my direction and in my presence.  I have reviewed the chart and discharge instructions (if applicable) and agree that the record reflects my personal performance and is accurate and complete. 04/05/2022   I,Shehryar Baig,acting as a scribe for Lemont Fillers, NP.,have documented all relevant documentation on the behalf of Lemont Fillers, NP,as directed by  Lemont Fillers, NP while in the presence of Lemont Fillers, NP.   Lemont Fillers, NP

## 2022-04-05 NOTE — Assessment & Plan Note (Signed)
Overall stable on zoloft 75mg  once daily. Keeps hydroxyzine on hand for anxiety PRN.  Continue same.

## 2022-04-09 ENCOUNTER — Other Ambulatory Visit (HOSPITAL_COMMUNITY): Payer: Self-pay

## 2022-04-19 ENCOUNTER — Other Ambulatory Visit (HOSPITAL_COMMUNITY): Payer: Self-pay

## 2022-04-19 ENCOUNTER — Other Ambulatory Visit: Payer: Self-pay | Admitting: Family Medicine

## 2022-04-19 DIAGNOSIS — F32A Depression, unspecified: Secondary | ICD-10-CM

## 2022-04-19 MED ORDER — HYDROXYZINE PAMOATE 25 MG PO CAPS
25.0000 mg | ORAL_CAPSULE | Freq: Three times a day (TID) | ORAL | 0 refills | Status: DC | PRN
  Filled 2022-04-19: qty 30, 10d supply, fill #0

## 2022-06-23 ENCOUNTER — Encounter: Payer: Self-pay | Admitting: Family Medicine

## 2022-06-23 ENCOUNTER — Telehealth: Payer: No Typology Code available for payment source | Admitting: Family Medicine

## 2022-06-23 ENCOUNTER — Other Ambulatory Visit (HOSPITAL_COMMUNITY): Payer: Self-pay

## 2022-06-23 DIAGNOSIS — R6889 Other general symptoms and signs: Secondary | ICD-10-CM

## 2022-06-23 MED ORDER — OSELTAMIVIR PHOSPHATE 75 MG PO CAPS
75.0000 mg | ORAL_CAPSULE | Freq: Two times a day (BID) | ORAL | 0 refills | Status: AC
  Filled 2022-06-23: qty 10, 5d supply, fill #0

## 2022-06-23 NOTE — Progress Notes (Signed)
E visit for Flu like symptoms   We are sorry that you are not feeling well.  Here is how we plan to help! Based on what you have shared with me it looks like you may have a respiratory virus that may be influenza.  Influenza or "the flu" is   an infection caused by a respiratory virus. The flu virus is highly contagious and persons who did not receive their yearly flu vaccination may "catch" the flu from close contact.  We have anti-viral medications to treat the viruses that cause this infection. They are not a "cure" and only shorten the course of the infection. These prescriptions are most effective when they are given within the first 2 days of "flu" symptoms. Antiviral medication are indicated if you have a high risk of complications from the flu. You should  also consider an antiviral medication if you are in close contact with someone who is at risk. These medications can help patients avoid complications from the flu  but have side effects that you should know. Possible side effects from Tamiflu or oseltamivir include nausea, vomiting, diarrhea, dizziness, headaches, eye redness, sleep problems or other respiratory symptoms. You should not take Tamiflu if you have an allergy to oseltamivir or any to the ingredients in Tamiflu.  Based upon your symptoms and potential risk factors I have prescribed Oseltamivir (Tamiflu).  It has been sent to your designated pharmacy.  You will take one 75 mg capsule orally twice a day for the next 5 days. and I recommend that you follow the flu symptoms recommendation that I have listed below.  ANYONE WHO HAS FLU SYMPTOMS SHOULD: Stay home. The flu is highly contagious and going out or to work exposes others! Be sure to drink plenty of fluids. Water is fine as well as fruit juices, sodas and electrolyte beverages. You may want to stay away from caffeine or alcohol. If you are nauseated, try taking small sips of liquids. How do you know if you are getting enough  fluid? Your urine should be a pale yellow or almost colorless. Get rest. Taking a steamy shower or using a humidifier may help nasal congestion and ease sore throat pain. Using a saline nasal spray works much the same way. Cough drops, hard candies and sore throat lozenges may ease your cough. Line up a caregiver. Have someone check on you regularly.   GET HELP RIGHT AWAY IF: You cannot keep down liquids or your medications. You become short of breath Your fell like you are going to pass out or loose consciousness. Your symptoms persist after you have completed your treatment plan MAKE SURE YOU  Understand these instructions. Will watch your condition. Will get help right away if you are not doing well or get worse.  Your e-visit answers were reviewed by a board certified advanced clinical practitioner to complete your personal care plan.  Depending on the condition, your plan could have included both over the counter or prescription medications.  If there is a problem please reply  once you have received a response from your provider.  Your safety is important to us.  If you have drug allergies check your prescription carefully.    You can use MyChart to ask questions about today's visit, request a non-urgent call back, or ask for a work or school excuse for 24 hours related to this e-Visit. If it has been greater than 24 hours you will need to follow up with your provider, or enter a   new e-Visit to address those concerns.  You will get an e-mail in the next two days asking about your experience.  I hope that your e-visit has been valuable and will speed your recovery. Thank you for using e-visits.  I provided 5 minutes of non face-to-face time during this encounter for chart review, medication and order placement, as well as and documentation.   

## 2022-07-06 ENCOUNTER — Other Ambulatory Visit (HOSPITAL_COMMUNITY)
Admission: RE | Admit: 2022-07-06 | Discharge: 2022-07-06 | Disposition: A | Discharge status: Home / Self Care | Payer: 59 | Source: Ambulatory Visit | Attending: Family Medicine | Admitting: Family Medicine

## 2022-07-06 ENCOUNTER — Encounter: Payer: Self-pay | Admitting: Family Medicine

## 2022-07-06 ENCOUNTER — Ambulatory Visit (INDEPENDENT_AMBULATORY_CARE_PROVIDER_SITE_OTHER): Payer: 59 | Admitting: Family Medicine

## 2022-07-06 VITALS — BP 124/78 | HR 93 | Temp 98.0°F | Resp 16 | Ht 64.0 in | Wt 166.0 lb

## 2022-07-06 DIAGNOSIS — Z1159 Encounter for screening for other viral diseases: Secondary | ICD-10-CM

## 2022-07-06 DIAGNOSIS — Z124 Encounter for screening for malignant neoplasm of cervix: Secondary | ICD-10-CM | POA: Insufficient documentation

## 2022-07-06 DIAGNOSIS — Z0000 Encounter for general adult medical examination without abnormal findings: Secondary | ICD-10-CM | POA: Diagnosis not present

## 2022-07-06 DIAGNOSIS — Z114 Encounter for screening for human immunodeficiency virus [HIV]: Secondary | ICD-10-CM

## 2022-07-06 LAB — CBC WITH DIFFERENTIAL/PLATELET
Basophils Absolute: 0.1 10*3/uL (ref 0.0–0.1)
Basophils Relative: 0.8 % (ref 0.0–3.0)
Eosinophils Absolute: 0.3 10*3/uL (ref 0.0–0.7)
Eosinophils Relative: 4.1 % (ref 0.0–5.0)
HCT: 37.1 % (ref 36.0–46.0)
Hemoglobin: 12.9 g/dL (ref 12.0–15.0)
Lymphocytes Relative: 20.3 % (ref 12.0–46.0)
Lymphs Abs: 1.7 10*3/uL (ref 0.7–4.0)
MCHC: 34.6 g/dL (ref 30.0–36.0)
MCV: 84.3 fl (ref 78.0–100.0)
Monocytes Absolute: 0.5 10*3/uL (ref 0.1–1.0)
Monocytes Relative: 6.1 % (ref 3.0–12.0)
Neutro Abs: 5.7 10*3/uL (ref 1.4–7.7)
Neutrophils Relative %: 68.7 % (ref 43.0–77.0)
Platelets: 305 10*3/uL (ref 150.0–400.0)
RBC: 4.41 Mil/uL (ref 3.87–5.11)
RDW: 12.7 % (ref 11.5–15.5)
WBC: 8.3 10*3/uL (ref 4.0–10.5)

## 2022-07-06 LAB — LIPID PANEL
Cholesterol: 159 mg/dL (ref 0–200)
HDL: 35 mg/dL — ABNORMAL LOW (ref >39.00)
NonHDL: 123.83
Total CHOL/HDL Ratio: 5
Triglycerides: 227 mg/dL — ABNORMAL HIGH (ref 0.0–149.0)
VLDL: 45.4 mg/dL — ABNORMAL HIGH (ref 0.0–40.0)

## 2022-07-06 LAB — COMPREHENSIVE METABOLIC PANEL
ALT: 17 U/L (ref 0–35)
AST: 13 U/L (ref 0–37)
Albumin: 4.6 g/dL (ref 3.5–5.2)
Alkaline Phosphatase: 64 U/L (ref 39–117)
BUN: 8 mg/dL (ref 6–23)
CO2: 27 mEq/L (ref 19–32)
Calcium: 9.5 mg/dL (ref 8.4–10.5)
Chloride: 104 mEq/L (ref 96–112)
Creatinine, Ser: 0.67 mg/dL (ref 0.40–1.20)
GFR: 124.5 mL/min (ref >60.00)
Glucose, Bld: 84 mg/dL (ref 70–99)
Potassium: 4.3 mEq/L (ref 3.5–5.1)
Sodium: 140 mEq/L (ref 135–145)
Total Bilirubin: 0.5 mg/dL (ref 0.2–1.2)
Total Protein: 7.1 g/dL (ref 6.0–8.3)

## 2022-07-06 LAB — LDL CHOLESTEROL, DIRECT: Direct LDL: 98 mg/dL

## 2022-07-06 LAB — TSH: TSH: 2.02 u[IU]/mL (ref 0.35–5.50)

## 2022-07-06 NOTE — Progress Notes (Signed)
Complete physical exam  Patient: Annette Baker   DOB: 01-17-2001   21 y.o. Female  MRN: [id number]  Subjective:    Chief Complaint  Patient presents with   Annual Exam    Annette Baker is a 22 y.o. female who presents today for a complete physical exam. She reports consuming a general diet. The patient does not participate in regular exercise at present. She generally feels well. She reports sleeping well. She does not have additional problems to discuss today.    Most recent fall risk assessment:    07/06/2022   11:01 AM  [place] in the past year? 0  Number falls in past yr: 0  Injury with Fall? 0     Most recent depression screenings:    07/06/2022   11:00 AM 04/05/2022   10:27 AM  PHQ 2/9 Scores  PHQ - 2 Score 0 0    Vision:Not within last year , Dental: No current dental problems and Receives regular dental care, and STD: no concerns; currently sexually active with one female partner    Patient Care Team: Terrilyn Saver, NP as PCP - General (Family Medicine)   Outpatient Medications Prior to Visit  Medication Sig   hydrOXYzine (VISTARIL) 25 MG capsule Take 1 capsule (25 mg total) by mouth every 8 (eight) hours as needed.   sertraline (ZOLOFT) 50 MG tablet Take 1 and 1/2 tablets (75 mg total) by mouth daily. Need office visit.   No facility-administered medications prior to visit.    ROS All review of systems negative except what is listed in the HPI        Objective:     BP 124/78   Pulse 93   Temp 98 F (36.7 C)   Resp 16   Ht _0  (1.626 m)   Wt 166 lb (75.3 kg)   SpO2 99%   BMI 28.49 kg/m    Physical Exam Vitals reviewed.  Constitutional:      General: She is not in acute distress.    Appearance: Normal appearance. She is not ill-appearing.  HENT:     Head: Normocephalic and atraumatic.     Right Ear: Tympanic membrane normal.     Left Ear: Tympanic membrane normal.     Nose: Nose normal.     Mouth/Throat:      Mouth: Mucous membranes are moist.     Pharynx: Oropharynx is clear.  Eyes:     Extraocular Movements: Extraocular movements intact.     Conjunctiva/sclera: Conjunctivae normal.     Pupils: Pupils are equal, round, and reactive to light.  Neck:     Vascular: No carotid bruit.  Cardiovascular:     Rate and Rhythm: Normal rate and regular rhythm.     Pulses: Normal pulses.     Heart sounds: Normal heart sounds.  Pulmonary:     Effort: Pulmonary effort is normal.     Breath sounds: Normal breath sounds.  Abdominal:     General: Abdomen is flat. Bowel sounds are normal. There is no distension.     Palpations: Abdomen is soft. There is no mass.     Tenderness: There is no abdominal tenderness. There is no right CVA tenderness, left CVA tenderness, guarding or rebound.  Genitourinary:    General: Normal vulva.     Vagina: Normal. No vaginal discharge.     Cervix: No cervical motion tenderness, discharge, friability, erythema or cervical bleeding.  Uterus: Not tender.      Adnexa:        Right: No mass, tenderness or fullness.         Left: No mass, tenderness or fullness.       Rectum: Normal.  Musculoskeletal:        General: Normal range of motion.     Cervical back: Normal range of motion and neck supple. No tenderness.     Right lower leg: No edema.     Left lower leg: No edema.  Lymphadenopathy:     Cervical: No cervical adenopathy.  Skin:    General: Skin is warm and dry.     Capillary Refill: Capillary refill takes less than 2 seconds.  Neurological:     General: No focal deficit present.     Mental Status: She is alert and oriented to person, place, and time. Mental status is at baseline.  Psychiatric:        Mood and Affect: Mood normal.        Behavior: Behavior normal.        Thought Content: Thought content normal.        Judgment: Judgment normal.         No results found for any visits on 07/06/22.     Assessment & Plan:    Routine Health  Maintenance and Physical Exam  Immunization History  Administered Date(s) Administered   DTaP 10/04/2000, 12/07/2000, 01/20/2001, 02/23/2002, 01/11/2006   HIB (PRP-OMP) 10/04/2000, 12/07/2000, 01/20/2001, 11/23/2001   Hepatitis A 01/11/2006, 08/05/2008   Hepatitis B 2001-01[birth year]01, 09/05/2000, 05/23/2001   IPV 10/04/2000, 12/07/2000, 05/23/2001, 01/11/2006   MMR 08/29/2001, 01/11/2006   Meningococcal Conjugate 02/22/2012, 11/10/2018   PFIZER(Purple Top)SARS-COV-2 Vaccination 09/14/2019, 10/08/2019, 04/21/2020   Pneumococcal Conjugate-13 10/04/2000, 12/07/2000, 01/20/2001, 11/23/2001   Tdap 02/22/2012   Varicella 08/29/2001, 01/11/2006    Health Maintenance  Topic Date Due   HPV VACCINES (1 - 2-dose series) Never done   Hepatitis C Screening  Never done   PAP-Cervical Cytology Screening  Never done   PAP SMEAR-Modifier  Never done   DTaP/Tdap/Td (7 - Td or Tdap) 02/21/2022   COVID-19 Vaccine (4 - 2023-24 season) 03/05/2022   INFLUENZA VACCINE  10/03/2022 (Originally 02/02/2022)   HIV Screening  Completed    Discussed health benefits of physical activity, and encouraged her to engage in regular exercise appropriate for her age and condition.  Problem List Items Addressed This Visit   None Visit Diagnoses     Annual physical exam    -  Primary   Relevant Orders   CBC with Differential/Platelet   Comprehensive metabolic panel   TSH   Lipid panel   Hepatitis C antibody   HIV Antibody (routine testing w rflx)   Cytology - PAP   Screening for cervical cancer       Relevant Orders   Cytology - PAP   Encounter for screening for HIV       Relevant Orders   HIV Antibody (routine testing w rflx)   Encounter for hepatitis C screening test for low risk patient       Relevant Orders   Hepatitis C antibody      Return in about 1 year (around 07/07/2023) for physical.     Terrilyn Saver, NP

## 2022-07-07 LAB — HIV ANTIBODY (ROUTINE TESTING W REFLEX): HIV 1&2 Ab, 4th Generation: NONREACTIVE

## 2022-07-07 LAB — CYTOLOGY - PAP: Diagnosis: NEGATIVE

## 2022-07-07 LAB — HEPATITIS C ANTIBODY: Hepatitis C Ab: NONREACTIVE

## 2022-08-10 ENCOUNTER — Other Ambulatory Visit: Payer: Self-pay | Admitting: Family Medicine

## 2022-08-10 ENCOUNTER — Other Ambulatory Visit (HOSPITAL_COMMUNITY): Payer: Self-pay

## 2022-08-10 ENCOUNTER — Other Ambulatory Visit: Payer: Self-pay

## 2022-08-10 DIAGNOSIS — F419 Anxiety disorder, unspecified: Secondary | ICD-10-CM

## 2022-08-10 MED ORDER — HYDROXYZINE PAMOATE 25 MG PO CAPS
25.0000 mg | ORAL_CAPSULE | Freq: Three times a day (TID) | ORAL | 0 refills | Status: DC | PRN
  Filled 2022-08-10: qty 27, 9d supply, fill #0
  Filled 2022-08-10: qty 2, 1d supply, fill #0
  Filled 2022-08-10: qty 30, 10d supply, fill #0
  Filled 2022-08-10: qty 28, 9d supply, fill #0

## 2022-08-19 ENCOUNTER — Other Ambulatory Visit (HOSPITAL_COMMUNITY): Payer: Self-pay

## 2022-09-05 ENCOUNTER — Other Ambulatory Visit: Payer: Self-pay | Admitting: Family Medicine

## 2022-09-05 DIAGNOSIS — F419 Anxiety disorder, unspecified: Secondary | ICD-10-CM

## 2022-09-06 ENCOUNTER — Other Ambulatory Visit (HOSPITAL_COMMUNITY): Payer: Self-pay

## 2022-09-06 MED ORDER — HYDROXYZINE PAMOATE 25 MG PO CAPS
25.0000 mg | ORAL_CAPSULE | Freq: Three times a day (TID) | ORAL | 0 refills | Status: DC | PRN
  Filled 2022-09-06: qty 30, 10d supply, fill #0

## 2022-09-28 ENCOUNTER — Other Ambulatory Visit: Payer: Self-pay | Admitting: Family Medicine

## 2022-09-28 DIAGNOSIS — F32A Anxiety disorder, unspecified: Secondary | ICD-10-CM

## 2022-09-29 ENCOUNTER — Other Ambulatory Visit (HOSPITAL_COMMUNITY): Payer: Self-pay

## 2022-09-29 MED ORDER — HYDROXYZINE PAMOATE 25 MG PO CAPS
25.0000 mg | ORAL_CAPSULE | Freq: Three times a day (TID) | ORAL | 0 refills | Status: DC | PRN
  Filled 2022-09-29: qty 30, 10d supply, fill #0

## 2022-10-03 ENCOUNTER — Telehealth: Payer: 59 | Admitting: Physician Assistant

## 2022-10-03 ENCOUNTER — Other Ambulatory Visit (HOSPITAL_COMMUNITY): Payer: Self-pay

## 2022-10-03 DIAGNOSIS — A084 Viral intestinal infection, unspecified: Secondary | ICD-10-CM

## 2022-10-03 MED ORDER — ONDANSETRON 4 MG PO TBDP: 4.0000 mg | ORAL_TABLET | Freq: Three times a day (TID) | ORAL | 0 refills | Status: DC | PRN

## 2022-10-03 MED ORDER — ONDANSETRON 4 MG PO TBDP
4.0000 mg | ORAL_TABLET | Freq: Three times a day (TID) | ORAL | 0 refills | Status: DC | PRN
  Filled 2022-10-03: qty 20, 7d supply, fill #0

## 2022-10-03 NOTE — Addendum Note (Signed)
Addended by: Mar Daring on: 10/03/2022 01:59 PM   Modules accepted: Orders

## 2022-10-03 NOTE — Progress Notes (Signed)
E-Visit for Nausea and Vomiting   We are sorry that you are not feeling well. Here is how we plan to help!  Based on what you have shared with me it looks like you have a Virus that is irritating your GI tract.  Vomiting is the forceful emptying of a portion of the stomach's content through the mouth.  Although nausea and vomiting can make you feel miserable, it's important to remember that these are not diseases, but rather symptoms of an underlying illness.  When we treat short term symptoms, we always caution that any symptoms that persist should be fully evaluated in a medical office.  I have prescribed a medication that will help alleviate your symptoms and allow you to stay hydrated:  Zofran 4 mg 1 tablet every 8 hours as needed for nausea and vomiting  HOME CARE: Drink clear liquids.  This is very important! Dehydration (the lack of fluid) can lead to a serious complication.  Start off with 1 tablespoon every 5 minutes for 8 hours. You may begin eating bland foods after 8 hours without vomiting.  Start with saltine crackers, white bread, rice, mashed potatoes, applesauce. After 48 hours on a bland diet, you may resume a normal diet. Try to go to sleep.  Sleep often empties the stomach and relieves the need to vomit.  GET HELP RIGHT AWAY IF:  Your symptoms do not improve or worsen within 2 days after treatment. You have a fever for over 3 days. You cannot keep down fluids after trying the medication.  MAKE SURE YOU:  Understand these instructions. Will watch your condition. Will get help right away if you are not doing well or get worse.    Thank you for choosing an e-visit.  Your e-visit answers were reviewed by a board certified advanced clinical practitioner to complete your personal care plan. Depending upon the condition, your plan could have included both over the counter or prescription medications.  Please review your pharmacy choice. Make sure the pharmacy is open so  you can pick up prescription now. If there is a problem, you may contact your provider through MyChart messaging and have the prescription routed to another pharmacy.  Your safety is important to us. If you have drug allergies check your prescription carefully.   For the next 24 hours you can use MyChart to ask questions about today's visit, request a non-urgent call back, or ask for a work or school excuse. You will get an email in the next two days asking about your experience. I hope that your e-visit has been valuable and will speed your recovery.  I have spent 5 minutes in review of e-visit questionnaire, review and updating patient chart, medical decision making and response to patient.   Rohini Jaroszewski M Balen Woolum, PA-C  

## 2022-11-10 ENCOUNTER — Other Ambulatory Visit: Payer: Self-pay | Admitting: Family Medicine

## 2022-11-10 ENCOUNTER — Other Ambulatory Visit (HOSPITAL_COMMUNITY): Payer: Self-pay

## 2022-11-10 DIAGNOSIS — F32A Depression, unspecified: Secondary | ICD-10-CM

## 2022-11-10 MED ORDER — HYDROXYZINE PAMOATE 25 MG PO CAPS
25.0000 mg | ORAL_CAPSULE | Freq: Three times a day (TID) | ORAL | 5 refills | Status: DC | PRN
  Filled 2022-11-10 – 2022-11-20 (×2): qty 30, 10d supply, fill #0
  Filled 2022-12-10: qty 30, 10d supply, fill #1
  Filled 2023-01-12 – 2023-01-13 (×2): qty 30, 10d supply, fill #2
  Filled 2023-02-03: qty 30, 10d supply, fill #3
  Filled 2023-03-29 – 2023-04-12 (×2): qty 30, 10d supply, fill #4
  Filled 2023-06-06: qty 30, 10d supply, fill #5

## 2022-11-20 ENCOUNTER — Other Ambulatory Visit (HOSPITAL_COMMUNITY): Payer: Self-pay

## 2022-12-10 ENCOUNTER — Other Ambulatory Visit (HOSPITAL_COMMUNITY): Payer: Self-pay

## 2022-12-10 ENCOUNTER — Other Ambulatory Visit: Payer: Self-pay | Admitting: Family

## 2022-12-10 DIAGNOSIS — F419 Anxiety disorder, unspecified: Secondary | ICD-10-CM

## 2022-12-10 MED ORDER — SERTRALINE HCL 50 MG PO TABS
75.0000 mg | ORAL_TABLET | Freq: Every day | ORAL | 2 refills | Status: DC
  Filled 2022-12-10: qty 135, 90d supply, fill #0
  Filled 2023-08-28: qty 135, 90d supply, fill #1

## 2022-12-11 ENCOUNTER — Other Ambulatory Visit (HOSPITAL_COMMUNITY): Payer: Self-pay

## 2022-12-13 ENCOUNTER — Other Ambulatory Visit: Payer: Self-pay

## 2023-01-13 ENCOUNTER — Other Ambulatory Visit (HOSPITAL_COMMUNITY): Payer: Self-pay

## 2023-04-04 ENCOUNTER — Other Ambulatory Visit (HOSPITAL_COMMUNITY): Payer: Self-pay

## 2023-04-04 DIAGNOSIS — J3089 Other allergic rhinitis: Secondary | ICD-10-CM | POA: Diagnosis not present

## 2023-04-04 DIAGNOSIS — J338 Other polyp of sinus: Secondary | ICD-10-CM | POA: Diagnosis not present

## 2023-04-04 DIAGNOSIS — J301 Allergic rhinitis due to pollen: Secondary | ICD-10-CM | POA: Diagnosis not present

## 2023-04-04 DIAGNOSIS — J3081 Allergic rhinitis due to animal (cat) (dog) hair and dander: Secondary | ICD-10-CM | POA: Diagnosis not present

## 2023-04-04 MED ORDER — EPINEPHRINE 0.3 MG/0.3ML IJ SOAJ
INTRAMUSCULAR | 1 refills | Status: AC
  Filled 2023-04-04: qty 2, 30d supply, fill #0

## 2023-04-08 ENCOUNTER — Other Ambulatory Visit (HOSPITAL_COMMUNITY): Payer: Self-pay

## 2023-04-12 ENCOUNTER — Other Ambulatory Visit: Payer: Self-pay | Admitting: Pharmacist

## 2023-04-12 ENCOUNTER — Other Ambulatory Visit (HOSPITAL_COMMUNITY): Payer: Self-pay

## 2023-04-12 ENCOUNTER — Ambulatory Visit: Payer: 59 | Admitting: Physician Assistant

## 2023-04-12 ENCOUNTER — Other Ambulatory Visit: Payer: Self-pay

## 2023-04-12 ENCOUNTER — Ambulatory Visit: Payer: 59 | Attending: Family Medicine | Admitting: Pharmacist

## 2023-04-12 ENCOUNTER — Ambulatory Visit: Payer: 59 | Admitting: Psychiatry

## 2023-04-12 ENCOUNTER — Encounter: Payer: Self-pay | Admitting: Psychiatry

## 2023-04-12 DIAGNOSIS — F4310 Post-traumatic stress disorder, unspecified: Secondary | ICD-10-CM

## 2023-04-12 DIAGNOSIS — F900 Attention-deficit hyperactivity disorder, predominantly inattentive type: Secondary | ICD-10-CM | POA: Diagnosis not present

## 2023-04-12 DIAGNOSIS — F3341 Major depressive disorder, recurrent, in partial remission: Secondary | ICD-10-CM | POA: Diagnosis not present

## 2023-04-12 DIAGNOSIS — Z7189 Other specified counseling: Secondary | ICD-10-CM

## 2023-04-12 MED ORDER — DUPIXENT 300 MG/2ML ~~LOC~~ SOAJ: SUBCUTANEOUS | 6 refills | Status: DC

## 2023-04-12 MED ORDER — DUPIXENT 300 MG/2ML ~~LOC~~ SOAJ
SUBCUTANEOUS | 6 refills | Status: DC
  Filled 2023-04-13: qty 4, fill #0
  Filled 2023-04-13 (×2): qty 4, 28d supply, fill #0
  Filled 2023-05-04: qty 4, 28d supply, fill #1
  Filled 2023-06-06 (×2): qty 4, 28d supply, fill #2
  Filled 2023-07-01: qty 4, 28d supply, fill #3
  Filled 2023-08-10: qty 4, 28d supply, fill #4
  Filled 2023-10-06: qty 4, 28d supply, fill #5
  Filled 2023-12-02 (×2): qty 4, 28d supply, fill #6
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
## 2023-04-12 NOTE — Progress Notes (Signed)
Please see my documentation from 04/12/2023 for counseling.   Butch Penny, PharmD, Patsy Baltimore, CPP Clinical Pharmacist [facility] [phone]

## 2023-04-12 NOTE — Progress Notes (Signed)
   S: Patient presents for review of their specialty medication therapy.  Patient is about to start taking Dupixent for nasal polyps. Patient is managed by Dr. Barnetta Chapel  for this.   Adherence: has not yet started   Efficacy: has not yet started   Dosing: 300 mg subcutaneously once every 14 days  Dose adjustments: Renal: no dose adjustments (has not been studied) Hepatic: no dose adjustments (has not been studied)  Drug-drug interactions: none identified   Monitoring: S/sx of infection: none S/sx of hypersensitivity: none  S/sx of ocular effects: none  S/sx of eosinophilia/vasculitis: none   O:     Lab Results  Component Value Date   WBC 8.3 07/06/2022   HGB 12.9 07/06/2022   HCT 37.1 07/06/2022   MCV 84.3 07/06/2022   PLT 305.0 07/06/2022      Chemistry      Component Value Date/Time   NA 140 07/06/2022 1114   K 4.3 07/06/2022 1114   CL 104 07/06/2022 1114   CO2 27 07/06/2022 1114   BUN 8 07/06/2022 1114   CREATININE 0.67 07/06/2022 1114      Component Value Date/Time   CALCIUM 9.5 07/06/2022 1114   ALKPHOS 64 07/06/2022 1114   AST 13 07/06/2022 1114   ALT 17 07/06/2022 1114   BILITOT 0.5 07/06/2022 1114       A/P: 1. Medication review: Patient is about to be on Dupixent for nasal polyps. Reviewed the medication with the patient, including the following: Dupixent is a monoclonal antibody used for the treatment of asthma or atopic dermatitis. Patient educated on purpose, proper use and potential adverse effects of Dupixent. Possible adverse effects include increased risk of infection, ocular effects, vasculitis/eosinophilia, and hypersensitivity reactions. Administer as a SubQ injection and rotate sites. Allow the medication to reach room temp prior to administration (45 mins for 300 mg syringe or 30 min for 200 mg syringe). Do not shake. Discard any unused portion. No recommendations for any changes.   Butch Penny, PharmD, Patsy Baltimore, CPP Clinical  Pharmacist [facility] [phone]

## 2023-04-13 ENCOUNTER — Other Ambulatory Visit: Payer: Self-pay

## 2023-04-13 NOTE — Progress Notes (Signed)
Specialty Pharmacy Initial Fill Coordination Note  Annette Baker is a 22 y.o. female contacted today regarding refills of specialty medication(s) Dupilumab   Patient requested Daryll Drown at [facility] at [place] date: 04/14/23   Medication will be filled on 10/9.   Patient is aware of $0 copayment.

## 2023-04-14 ENCOUNTER — Other Ambulatory Visit (HOSPITAL_COMMUNITY): Payer: Self-pay

## 2023-04-28 DIAGNOSIS — J338 Other polyp of sinus: Secondary | ICD-10-CM | POA: Diagnosis not present

## 2023-05-04 ENCOUNTER — Other Ambulatory Visit: Payer: Self-pay

## 2023-05-04 NOTE — Progress Notes (Signed)
Specialty Pharmacy Refill Coordination Note  Annette Baker is a 22 y.o. female contacted today regarding refills of specialty medication(s) Dupilumab   Patient requested Daryll Drown at [facility] at [place] date: 05/10/23   Medication will be filled on 05/09/23.

## 2023-05-09 ENCOUNTER — Other Ambulatory Visit: Payer: Self-pay

## 2023-05-11 ENCOUNTER — Other Ambulatory Visit (HOSPITAL_COMMUNITY): Payer: Self-pay

## 2023-05-31 ENCOUNTER — Other Ambulatory Visit (HOSPITAL_COMMUNITY): Payer: Self-pay

## 2023-06-01 ENCOUNTER — Other Ambulatory Visit (HOSPITAL_COMMUNITY): Payer: Self-pay

## 2023-06-01 ENCOUNTER — Encounter (HOSPITAL_COMMUNITY): Payer: Self-pay

## 2023-06-03 ENCOUNTER — Other Ambulatory Visit (HOSPITAL_COMMUNITY): Payer: Self-pay

## 2023-06-06 ENCOUNTER — Other Ambulatory Visit: Payer: Self-pay

## 2023-06-06 NOTE — Progress Notes (Signed)
Specialty Pharmacy Refill Coordination Note  Annette Baker is a 22 y.o. female contacted today regarding refills of specialty medication(s) Dupilumab   Patient requested Annette Baker at [facility] at [place] date: 06/08/23   Medication will be filled on 06/07/23.

## 2023-06-07 ENCOUNTER — Other Ambulatory Visit: Payer: Self-pay

## 2023-06-08 ENCOUNTER — Other Ambulatory Visit (HOSPITAL_COMMUNITY): Payer: Self-pay

## 2023-06-27 ENCOUNTER — Other Ambulatory Visit: Payer: Self-pay

## 2023-07-01 ENCOUNTER — Other Ambulatory Visit: Payer: Self-pay

## 2023-07-01 NOTE — Progress Notes (Signed)
Specialty Pharmacy Refill Coordination Note  Annette Baker is a 22 y.o. female contacted today regarding refills of specialty medication(s) Dupilumab (Dupixent)   Patient requested (Patient-Rptd) Pickup at [facility] at [facility] date: (Patient-Rptd) 07/13/23   Medication will be filled on 01.08.25.

## 2023-07-08 ENCOUNTER — Encounter: Payer: 59 | Admitting: Family Medicine

## 2023-07-12 ENCOUNTER — Other Ambulatory Visit: Payer: Self-pay

## 2023-07-14 ENCOUNTER — Other Ambulatory Visit: Payer: Self-pay | Admitting: Family Medicine

## 2023-07-14 DIAGNOSIS — F419 Anxiety disorder, unspecified: Secondary | ICD-10-CM

## 2023-07-15 ENCOUNTER — Other Ambulatory Visit (HOSPITAL_COMMUNITY): Payer: Self-pay

## 2023-07-15 MED ORDER — HYDROXYZINE PAMOATE 25 MG PO CAPS
25.0000 mg | ORAL_CAPSULE | Freq: Three times a day (TID) | ORAL | 5 refills | Status: DC | PRN
  Filled 2023-07-15: qty 30, 10d supply, fill #0

## 2023-07-26 ENCOUNTER — Other Ambulatory Visit (HOSPITAL_COMMUNITY): Payer: Self-pay

## 2023-08-03 ENCOUNTER — Other Ambulatory Visit: Payer: Self-pay

## 2023-08-05 ENCOUNTER — Other Ambulatory Visit: Payer: Self-pay

## 2023-08-08 ENCOUNTER — Other Ambulatory Visit (HOSPITAL_COMMUNITY): Payer: Self-pay

## 2023-08-10 ENCOUNTER — Telehealth: Payer: 59 | Admitting: Physician Assistant

## 2023-08-10 ENCOUNTER — Other Ambulatory Visit: Payer: Self-pay

## 2023-08-10 ENCOUNTER — Other Ambulatory Visit (HOSPITAL_COMMUNITY): Payer: Self-pay

## 2023-08-10 DIAGNOSIS — R6889 Other general symptoms and signs: Secondary | ICD-10-CM | POA: Diagnosis not present

## 2023-08-10 MED ORDER — OSELTAMIVIR PHOSPHATE 75 MG PO CAPS
75.0000 mg | ORAL_CAPSULE | Freq: Two times a day (BID) | ORAL | 0 refills | Status: DC
  Filled 2023-08-10: qty 10, 5d supply, fill #0

## 2023-08-10 NOTE — Progress Notes (Signed)
 Specialty Pharmacy Refill Coordination Note  Annette Baker is a 23 y.o. female contacted today regarding refills of specialty medication(s) Dupilumab  (Dupixent )   Patient requested (Patient-Rptd) Pickup at [facility] at [facility] date: (Patient-Rptd) 08/11/23   Medication will be filled on 08/10/23.

## 2023-08-10 NOTE — Progress Notes (Signed)
 I have spent 5 minutes in review of e-visit questionnaire, review and updating patient chart, medical decision making and response to patient.   Piedad Climes, PA-C

## 2023-08-10 NOTE — Progress Notes (Signed)

## 2023-08-15 ENCOUNTER — Encounter: Payer: 59 | Admitting: Family Medicine

## 2023-08-24 ENCOUNTER — Other Ambulatory Visit (HOSPITAL_BASED_OUTPATIENT_CLINIC_OR_DEPARTMENT_OTHER): Payer: Self-pay

## 2023-08-29 ENCOUNTER — Other Ambulatory Visit: Payer: Self-pay

## 2023-08-31 ENCOUNTER — Other Ambulatory Visit: Payer: Self-pay

## 2023-09-02 ENCOUNTER — Other Ambulatory Visit: Payer: Self-pay

## 2023-09-05 ENCOUNTER — Other Ambulatory Visit: Payer: Self-pay

## 2023-09-08 ENCOUNTER — Other Ambulatory Visit (HOSPITAL_COMMUNITY): Payer: Self-pay

## 2023-09-19 ENCOUNTER — Encounter: Payer: Self-pay | Admitting: Family Medicine

## 2023-09-19 ENCOUNTER — Ambulatory Visit: Admitting: Family Medicine

## 2023-09-19 ENCOUNTER — Other Ambulatory Visit (HOSPITAL_COMMUNITY): Payer: Self-pay

## 2023-09-19 VITALS — BP 120/79 | HR 98 | Ht 64.0 in | Wt 178.0 lb

## 2023-09-19 DIAGNOSIS — F419 Anxiety disorder, unspecified: Secondary | ICD-10-CM

## 2023-09-19 DIAGNOSIS — F32A Depression, unspecified: Secondary | ICD-10-CM | POA: Diagnosis not present

## 2023-09-19 DIAGNOSIS — R4586 Emotional lability: Secondary | ICD-10-CM

## 2023-09-19 MED ORDER — HYDROXYZINE PAMOATE 25 MG PO CAPS
25.0000 mg | ORAL_CAPSULE | Freq: Three times a day (TID) | ORAL | 5 refills | Status: AC | PRN
  Filled 2023-09-19: qty 30, 10d supply, fill #0
  Filled 2023-10-14: qty 30, 10d supply, fill #1
  Filled 2024-02-27: qty 30, 10d supply, fill #2

## 2023-09-19 MED ORDER — SERTRALINE HCL 50 MG PO TABS
75.0000 mg | ORAL_TABLET | Freq: Every day | ORAL | 2 refills | Status: AC
  Filled 2023-09-19: qty 135, 90d supply, fill #0

## 2023-09-19 NOTE — Progress Notes (Signed)
 Established Patient Office Visit  Subjective   Patient ID: Annette Baker, female    DOB: 2000-10-10  Age: 23 y.o. MRN: [id number]  Chief Complaint  Patient presents with   Anxiety   Depression    HPI  Discussed the use of AI scribe software for clinical note transcription with the patient, who gave verbal consent to proceed.  History of Present Illness Annette Baker is a 23 year old female who presents with mood disturbances and concerns about possible bipolar disorder or borderline personality disorder.  She experiences significant mood swings that have been affecting her daily life and relationships. She has been contemplating the possibility of having bipolar disorder or borderline personality disorder for years, but the impact on her life has recently intensified.  She acknowledges having passive thoughts of self-harm but denies any active plans or intentions to harm herself. She has engaged in self-harm behaviors in the past. No access to any weapons.  She was previously taking Zoloft 75 mg, one and a half tablets, but has not been taking it for about a month after running out of the medication and being unable to refill it due to having COVID. She reports a worsening of her symptoms since discontinuing the medication. She has not been using hydroxyzine, which was previously prescribed to her.  She has been experiencing difficulty sleeping, particularly with falling asleep due to persistent thoughts. Her eating habits are described as 'okay'.       09/19/2023    1:20 PM 07/06/2022   11:00 AM 04/05/2022   10:27 AM  PHQ9 SCORE ONLY  PHQ-9 Total Score 24 0 0      09/19/2023    1:21 PM 10/26/2021   11:21 AM 09/14/2021    4:10 PM  GAD 7 : Generalized Anxiety Score  Nervous, Anxious, on Edge 3 1 3   Control/stop worrying 3 1 2   Worry too much - different things 3 1 2   Trouble relaxing 2 1 2   Restless 2 3 3   Easily annoyed or irritable 3 1 3   Afraid - awful might  happen 3 1 2   Total GAD 7 Score 19 9 17   Anxiety Difficulty Somewhat difficult Somewhat difficult Somewhat difficult         ROS All review of systems negative except what is listed in the HPI    Objective:     BP 120/79   Pulse 98   Ht 5\' 4"  (1.626 m)   Wt 178 lb (80.7 kg)   SpO2 100%   BMI 30.55 kg/m    Physical Exam Vitals reviewed.  Constitutional:      Appearance: Normal appearance.  Cardiovascular:     Rate and Rhythm: Normal rate and regular rhythm.  Pulmonary:     Effort: Pulmonary effort is normal.     Breath sounds: Normal breath sounds.  Skin:    General: Skin is warm and dry.  Neurological:     Mental Status: She is alert and oriented to person, place, and time.  Psychiatric:        Mood and Affect: Mood normal.        Behavior: Behavior normal.        Thought Content: Thought content normal.        Judgment: Judgment normal.      No results found for any visits on 09/19/23.    The ASCVD Risk score (Arnett DK, et al., 2019) failed to calculate for the following reasons:   The  2019 ASCVD risk score is only valid for ages 61 to 58    Assessment & Plan:   Problem List Items Addressed This Visit       Active Problems   Anxiety and depression - Primary   Mood swings, passive self-harm thoughts, and self-harming behavior noted. Concerns about bipolar disorder or borderline personality disorder. Symptoms worsened post-sertraline discontinuation. - Restart sertraline: 1 tablet daily for 1 week, then 1.5 tablets daily. - Refer to psychiatrist for evaluation of bipolar disorder or borderline personality disorder. - Refill hydroxyzine PRN for anxiety and sleep  - Provided information on community resources and crisis intervention services, including [facility] in [place] - Advised immediate medical attention if active self-harm thoughts or plans develop. - Schedule follow-up in 6 weeks to reassess mood and medication efficacy.       Relevant Medications   sertraline (ZOLOFT) 50 MG tablet   hydrOXYzine (VISTARIL) 25 MG capsule   Other Visit Diagnoses       Mood changes     See above.   Relevant Medications   sertraline (ZOLOFT) 50 MG tablet   hydrOXYzine (VISTARIL) 25 MG capsule   Other Relevant Orders   Ambulatory referral to Behavioral Health           Return in about 6 weeks (around 10/31/2023) for mood follow-up.    Clayborne Dana, NP

## 2023-09-19 NOTE — Assessment & Plan Note (Signed)
 Mood swings, passive self-harm thoughts, and self-harming behavior noted. Concerns about bipolar disorder or borderline personality disorder. Symptoms worsened post-sertraline discontinuation. - Restart sertraline: 1 tablet daily for 1 week, then 1.5 tablets daily. - Refer to psychiatrist for evaluation of bipolar disorder or borderline personality disorder. - Refill hydroxyzine PRN for anxiety and sleep  - Provided information on community resources and crisis intervention services, including [facility] in [place] - Advised immediate medical attention if active self-harm thoughts or plans develop. - Schedule follow-up in 6 weeks to reassess mood and medication efficacy.

## 2023-09-19 NOTE — Patient Instructions (Signed)
[facility] - Urgent care services - Outpatient services: Individual Therapy Partial Hospitalization Program (PHP) Substance Abuse Intensive Outpatient Program [facility]) Specialized Intensive Adult Group Therapy Medication Management Peer Living Room  Phone: [phone]  Address: [place], [place] [address]  Hours: Open 24/7, No appointment required.

## 2023-10-06 ENCOUNTER — Other Ambulatory Visit (HOSPITAL_COMMUNITY): Payer: Self-pay

## 2023-10-06 NOTE — Progress Notes (Signed)
 Specialty Pharmacy Ongoing Clinical Assessment Note  Annette Baker is a 23 y.o. female who is being followed by the specialty pharmacy service for RxSp Allergy   Patient's specialty medication(s) reviewed today: Dupilumab (Dupixent)   Missed doses in the last 4 weeks: 2 (discussed using a calendar to track doses)   Patient/Caregiver did not have any additional questions or concerns.   Therapeutic benefit summary: Patient is achieving benefit   Adverse events/side effects summary: No adverse events/side effects   Patient's therapy is appropriate to: Continue    Goals Addressed             This Visit's Progress    Maintain optimal adherence to therapy   Improving    Patient is not on track and improving. Patient will maintain adherence         Follow up:  6 months  Servando Snare Specialty Pharmacist

## 2023-10-06 NOTE — Progress Notes (Signed)
 Specialty Pharmacy Refill Coordination Note  Annette Baker is a 23 y.o. female contacted today regarding refills of specialty medication(s) Dupilumab (Dupixent)   Patient requested Daryll Drown at [facility] at [place] date: 10/13/23   Medication will be filled on 10/12/23.

## 2023-10-12 ENCOUNTER — Other Ambulatory Visit: Payer: Self-pay

## 2023-10-12 NOTE — Progress Notes (Signed)
PA submitted and pt notified

## 2023-10-12 NOTE — Progress Notes (Signed)
 Dupixent Needs Prior Auth. Routed to Tiffany P.

## 2023-10-12 NOTE — Progress Notes (Signed)
 Pharmacy Patient Advocate Encounter   Received notification from Patient Pharmacy that prior authorization for Dupixent is required/requested.   Insurance verification completed.   The patient is insured through [facility] .   Per test claim: PA required; PA submitted to above mentioned insurance via CoverMyMeds Key/confirmation #/EOC [id number] Status is pending

## 2023-10-13 ENCOUNTER — Other Ambulatory Visit: Payer: Self-pay

## 2023-10-13 NOTE — Progress Notes (Signed)
 Pharmacy Patient Advocate Encounter  Received notification from [facility] that Prior Authorization for Dupixent has been APPROVED from 10/13/23 to 10/11/24   PA #/Case ID/Reference #: [id number]

## 2023-10-13 NOTE — Progress Notes (Signed)
 PA approved.

## 2023-10-31 ENCOUNTER — Ambulatory Visit: Admitting: Family Medicine

## 2023-11-01 ENCOUNTER — Other Ambulatory Visit: Payer: Self-pay

## 2023-11-02 ENCOUNTER — Ambulatory Visit: Admitting: Family Medicine

## 2023-11-07 ENCOUNTER — Other Ambulatory Visit: Payer: Self-pay

## 2023-11-09 ENCOUNTER — Other Ambulatory Visit (HOSPITAL_COMMUNITY): Payer: Self-pay

## 2023-11-09 ENCOUNTER — Other Ambulatory Visit: Payer: Self-pay

## 2023-12-02 ENCOUNTER — Other Ambulatory Visit: Payer: Self-pay

## 2023-12-02 ENCOUNTER — Other Ambulatory Visit: Payer: Self-pay | Admitting: Pharmacy Technician

## 2023-12-02 NOTE — Progress Notes (Signed)
 Specialty Pharmacy Refill Coordination Note  Annette Baker is a 23 y.o. female contacted today regarding refills of specialty medication(s) Dupilumab  (Dupixent )   Patient requested (Patient-Rptd) Pickup at [facility] at [place] date: 12/02/23   Medication will be filled on 12/02/23.

## 2023-12-05 ENCOUNTER — Other Ambulatory Visit (HOSPITAL_COMMUNITY): Payer: Self-pay

## 2023-12-05 DIAGNOSIS — F411 Generalized anxiety disorder: Secondary | ICD-10-CM | POA: Diagnosis not present

## 2023-12-05 DIAGNOSIS — F332 Major depressive disorder, recurrent severe without psychotic features: Secondary | ICD-10-CM | POA: Diagnosis not present

## 2023-12-05 MED ORDER — FLUOXETINE HCL 20 MG PO CAPS
20.0000 mg | ORAL_CAPSULE | Freq: Every day | ORAL | 0 refills | Status: DC
  Filled 2023-12-05: qty 30, 30d supply, fill #0

## 2023-12-27 ENCOUNTER — Other Ambulatory Visit: Payer: Self-pay

## 2023-12-27 ENCOUNTER — Encounter (INDEPENDENT_AMBULATORY_CARE_PROVIDER_SITE_OTHER): Payer: Self-pay

## 2023-12-28 ENCOUNTER — Other Ambulatory Visit: Payer: Self-pay

## 2023-12-29 ENCOUNTER — Other Ambulatory Visit: Payer: Self-pay

## 2023-12-30 ENCOUNTER — Other Ambulatory Visit: Payer: Self-pay

## 2023-12-30 ENCOUNTER — Other Ambulatory Visit: Payer: Self-pay | Admitting: Pharmacist

## 2023-12-30 MED ORDER — DUPIXENT 300 MG/2ML ~~LOC~~ SOAJ: SUBCUTANEOUS | 3 refills | Status: DC

## 2023-12-30 MED ORDER — DUPIXENT 300 MG/2ML ~~LOC~~ SOAJ
SUBCUTANEOUS | 3 refills | Status: AC
  Filled 2023-12-30: qty 4, 28d supply, fill #0
  Filled 2024-02-01 – 2024-03-09 (×4): qty 4, 28d supply, fill #1
  Filled 2024-04-02 – 2024-05-28 (×6): qty 4, 28d supply, fill #2
  Filled 2024-06-22: qty 4, 28d supply, fill #3

## 2023-12-30 NOTE — Progress Notes (Signed)
 Specialty Pharmacy Refill Coordination Note  AME HEAGLE is a 23 y.o. female contacted today regarding refills of specialty medication(s) Dupilumab  (Dupixent )   Patient requested Marylyn at [facility] at [place] date: 12/28/23   Medication will be filled when refill request is approved. Refill request sent to [facility] twice and patient was sent rx delay notification.

## 2024-01-03 ENCOUNTER — Other Ambulatory Visit (HOSPITAL_COMMUNITY): Payer: Self-pay

## 2024-01-03 DIAGNOSIS — F331 Major depressive disorder, recurrent, moderate: Secondary | ICD-10-CM | POA: Diagnosis not present

## 2024-01-03 DIAGNOSIS — F411 Generalized anxiety disorder: Secondary | ICD-10-CM | POA: Diagnosis not present

## 2024-01-03 MED ORDER — FLUOXETINE HCL 20 MG PO CAPS
40.0000 mg | ORAL_CAPSULE | Freq: Every day | ORAL | 2 refills | Status: AC
  Filled 2024-01-03: qty 60, 30d supply, fill #0
  Filled 2024-02-27: qty 60, 30d supply, fill #1

## 2024-02-01 ENCOUNTER — Other Ambulatory Visit: Payer: Self-pay

## 2024-02-03 ENCOUNTER — Other Ambulatory Visit (HOSPITAL_COMMUNITY): Payer: Self-pay

## 2024-02-27 ENCOUNTER — Other Ambulatory Visit: Payer: Self-pay

## 2024-03-08 ENCOUNTER — Other Ambulatory Visit (HOSPITAL_COMMUNITY): Payer: Self-pay

## 2024-03-09 ENCOUNTER — Other Ambulatory Visit (HOSPITAL_COMMUNITY): Payer: Self-pay

## 2024-03-09 ENCOUNTER — Other Ambulatory Visit: Payer: Self-pay

## 2024-03-09 DIAGNOSIS — F411 Generalized anxiety disorder: Secondary | ICD-10-CM | POA: Diagnosis not present

## 2024-03-09 DIAGNOSIS — F331 Major depressive disorder, recurrent, moderate: Secondary | ICD-10-CM | POA: Diagnosis not present

## 2024-03-09 MED ORDER — BUPROPION HCL ER (XL) 150 MG PO TB24
150.0000 mg | ORAL_TABLET | Freq: Every day | ORAL | 1 refills | Status: AC
  Filled 2024-03-09: qty 30, 30d supply, fill #0

## 2024-03-09 NOTE — Progress Notes (Signed)
 Specialty Pharmacy Refill Coordination Note  KHALIL BELOTE is a 23 y.o. female contacted today regarding refills of specialty medication(s) Dupilumab  (Dupixent )   Patient requested Marylyn at [facility] at [place] date: 03/09/24   Medication will be filled on 03/09/24.       Clinical Intervention Note  Clinical Intervention Notes: Patient to start taking buproprion today. No DDIs identified with Dupixent    Clinical Intervention Outcomes: Prevention of an adverse drug event   Advertising account planner

## 2024-03-29 ENCOUNTER — Other Ambulatory Visit: Payer: Self-pay

## 2024-03-30 ENCOUNTER — Other Ambulatory Visit: Payer: Self-pay

## 2024-04-02 ENCOUNTER — Other Ambulatory Visit: Payer: Self-pay

## 2024-04-04 ENCOUNTER — Other Ambulatory Visit: Payer: Self-pay

## 2024-04-06 ENCOUNTER — Other Ambulatory Visit (HOSPITAL_COMMUNITY): Payer: Self-pay

## 2024-04-06 DIAGNOSIS — F331 Major depressive disorder, recurrent, moderate: Secondary | ICD-10-CM | POA: Diagnosis not present

## 2024-04-06 DIAGNOSIS — F411 Generalized anxiety disorder: Secondary | ICD-10-CM | POA: Diagnosis not present

## 2024-04-06 MED ORDER — BUPROPION HCL ER (XL) 150 MG PO TB24
150.0000 mg | ORAL_TABLET | Freq: Every day | ORAL | 1 refills | Status: AC
  Filled 2024-04-06: qty 30, 30d supply, fill #0

## 2024-04-06 MED ORDER — ARIPIPRAZOLE 2 MG PO TABS
2.0000 mg | ORAL_TABLET | Freq: Every day | ORAL | 0 refills | Status: DC
  Filled 2024-04-06: qty 30, 30d supply, fill #0

## 2024-04-11 ENCOUNTER — Ambulatory Visit: Attending: Family Medicine | Admitting: Pharmacist

## 2024-04-11 DIAGNOSIS — Z7189 Other specified counseling: Secondary | ICD-10-CM

## 2024-04-11 NOTE — Progress Notes (Signed)
   S: Patient presents for review of their specialty medication therapy.  Patient is taking Dupixent  for nasal polyps. Patient is managed by Dr. Whelan  for this.   Adherence: reported  Efficacy: reports that the medication works well for her  Dosing: 300 mg subcutaneously once every 14 days  Dose adjustments: Renal: no dose adjustments (has not been studied) Hepatic: no dose adjustments (has not been studied)  Drug-drug interactions: none identified   Monitoring: S/sx of infection: none S/sx of hypersensitivity: none  S/sx of ocular effects: none  S/sx of eosinophilia/vasculitis: none   O:     Lab Results  Component Value Date   WBC 8.3 07/06/2022   HGB 12.9 07/06/2022   HCT 37.1 07/06/2022   MCV 84.3 07/06/2022   PLT 305.0 07/06/2022      Chemistry      Component Value Date/Time   NA 140 07/06/2022 1114   K 4.3 07/06/2022 1114   CL 104 07/06/2022 1114   CO2 27 07/06/2022 1114   BUN 8 07/06/2022 1114   CREATININE 0.67 07/06/2022 1114      Component Value Date/Time   CALCIUM 9.5 07/06/2022 1114   ALKPHOS 64 07/06/2022 1114   AST 13 07/06/2022 1114   ALT 17 07/06/2022 1114   BILITOT 0.5 07/06/2022 1114       A/P: 1. Medication review: Patient is taking Dupixent  for nasal polyps. Reviewed the medication with the patient, including the following: Dupixent  is a monoclonal antibody used for the treatment of asthma or atopic dermatitis. Patient educated on purpose, proper use and potential adverse effects of Dupixent . Possible adverse effects include increased risk of infection, ocular effects, vasculitis/eosinophilia, and hypersensitivity reactions. Administer as a SubQ injection and rotate sites. Allow the medication to reach room temp prior to administration (45 mins for 300 mg syringe or 30 min for 200 mg syringe). Do not shake. Discard any unused portion. No recommendations for any changes.   Herlene Fleeta Morris, PharmD, Annette Baker, CPP Clinical  Pharmacist [facility] [phone]

## 2024-04-20 ENCOUNTER — Other Ambulatory Visit: Payer: Self-pay

## 2024-04-25 ENCOUNTER — Other Ambulatory Visit: Payer: Self-pay

## 2024-05-04 ENCOUNTER — Other Ambulatory Visit: Payer: Self-pay

## 2024-05-04 ENCOUNTER — Other Ambulatory Visit (HOSPITAL_COMMUNITY): Payer: Self-pay

## 2024-05-04 DIAGNOSIS — F411 Generalized anxiety disorder: Secondary | ICD-10-CM | POA: Diagnosis not present

## 2024-05-04 DIAGNOSIS — F330 Major depressive disorder, recurrent, mild: Secondary | ICD-10-CM | POA: Diagnosis not present

## 2024-05-04 MED ORDER — ARIPIPRAZOLE 2 MG PO TABS
2.0000 mg | ORAL_TABLET | Freq: Every day | ORAL | 0 refills | Status: DC
  Filled 2024-05-04: qty 30, 30d supply, fill #0

## 2024-05-04 MED ORDER — BUPROPION HCL ER (XL) 150 MG PO TB24
150.0000 mg | ORAL_TABLET | Freq: Every day | ORAL | 1 refills | Status: AC
  Filled 2024-05-04: qty 30, 30d supply, fill #0

## 2024-05-28 ENCOUNTER — Other Ambulatory Visit: Payer: Self-pay

## 2024-05-28 NOTE — Progress Notes (Signed)
 Specialty Pharmacy Refill Coordination Note  RIKA DAUGHDRILL is a 23 y.o. female contacted today regarding refills of specialty medication(s) Dupilumab  (Dupixent )   Patient requested Marylyn at [facility] at [place] date: 05/29/24   Medication will be filled on: 05/28/24

## 2024-05-29 ENCOUNTER — Other Ambulatory Visit (HOSPITAL_COMMUNITY): Payer: Self-pay

## 2024-05-29 MED ORDER — BUPROPION HCL ER (XL) 150 MG PO TB24
150.0000 mg | ORAL_TABLET | Freq: Every day | ORAL | 1 refills | Status: AC
  Filled 2024-05-29: qty 30, 30d supply, fill #0

## 2024-05-29 MED ORDER — ARIPIPRAZOLE 2 MG PO TABS
2.0000 mg | ORAL_TABLET | Freq: Every day | ORAL | 0 refills | Status: DC
  Filled 2024-05-29: qty 30, 30d supply, fill #0

## 2024-06-18 ENCOUNTER — Other Ambulatory Visit (HOSPITAL_COMMUNITY): Payer: Self-pay

## 2024-06-21 ENCOUNTER — Other Ambulatory Visit (HOSPITAL_COMMUNITY): Payer: Self-pay

## 2024-06-21 MED ORDER — ARIPIPRAZOLE 2 MG PO TABS
2.0000 mg | ORAL_TABLET | Freq: Every day | ORAL | 0 refills | Status: AC
  Filled 2024-06-21: qty 30, 30d supply, fill #0

## 2024-06-22 ENCOUNTER — Other Ambulatory Visit: Payer: Self-pay

## 2024-06-22 ENCOUNTER — Other Ambulatory Visit (HOSPITAL_COMMUNITY): Payer: Self-pay

## 2024-06-22 ENCOUNTER — Other Ambulatory Visit: Payer: Self-pay | Admitting: Pharmacy Technician

## 2024-06-22 ENCOUNTER — Encounter (INDEPENDENT_AMBULATORY_CARE_PROVIDER_SITE_OTHER): Payer: Self-pay

## 2024-06-22 NOTE — Progress Notes (Signed)
 Specialty Pharmacy Refill Coordination Note  Annette Baker is a 23 y.o. female contacted today regarding refills of specialty medication(s) Dupilumab  (Dupixent )   Patient requested (Patient-Rptd) Pickup at [facility] at [place] date: 06/26/24   Medication will be filled on: 06/25/24

## 2024-06-25 ENCOUNTER — Other Ambulatory Visit (HOSPITAL_COMMUNITY): Payer: Self-pay

## 2024-06-25 ENCOUNTER — Other Ambulatory Visit: Payer: Self-pay

## 2024-06-25 DIAGNOSIS — F411 Generalized anxiety disorder: Secondary | ICD-10-CM | POA: Diagnosis not present

## 2024-06-25 DIAGNOSIS — F331 Major depressive disorder, recurrent, moderate: Secondary | ICD-10-CM | POA: Diagnosis not present

## 2024-06-25 MED ORDER — ARIPIPRAZOLE 5 MG PO TABS
5.0000 mg | ORAL_TABLET | Freq: Every day | ORAL | 0 refills | Status: DC
  Filled 2024-06-25 – 2024-07-10 (×2): qty 30, 30d supply, fill #0

## 2024-06-25 MED ORDER — BUPROPION HCL ER (XL) 150 MG PO TB24
150.0000 mg | ORAL_TABLET | Freq: Every day | ORAL | 1 refills | Status: AC
  Filled 2024-06-25: qty 30, 30d supply, fill #0

## 2024-07-02 ENCOUNTER — Other Ambulatory Visit: Payer: Self-pay

## 2024-07-02 ENCOUNTER — Other Ambulatory Visit (HOSPITAL_COMMUNITY): Payer: Self-pay

## 2024-07-03 ENCOUNTER — Other Ambulatory Visit: Payer: Self-pay

## 2024-07-09 ENCOUNTER — Other Ambulatory Visit: Payer: Self-pay

## 2024-07-10 ENCOUNTER — Other Ambulatory Visit: Payer: Self-pay

## 2024-07-10 ENCOUNTER — Other Ambulatory Visit (HOSPITAL_COMMUNITY): Payer: Self-pay

## 2024-07-11 ENCOUNTER — Other Ambulatory Visit: Payer: Self-pay

## 2024-07-11 ENCOUNTER — Encounter: Payer: Self-pay | Admitting: Pharmacist

## 2024-07-13 ENCOUNTER — Other Ambulatory Visit: Payer: Self-pay

## 2024-07-24 ENCOUNTER — Other Ambulatory Visit: Payer: Self-pay

## 2024-07-24 ENCOUNTER — Other Ambulatory Visit (HOSPITAL_COMMUNITY): Payer: Self-pay

## 2024-07-27 ENCOUNTER — Other Ambulatory Visit (HOSPITAL_COMMUNITY): Payer: Self-pay

## 2024-07-27 MED ORDER — BUPROPION HCL ER (XL) 150 MG PO TB24
150.0000 mg | ORAL_TABLET | Freq: Every day | ORAL | 1 refills | Status: AC
  Filled 2024-07-27: qty 30, 30d supply, fill #0

## 2024-07-27 MED ORDER — ARIPIPRAZOLE 5 MG PO TABS: 5.0000 mg | ORAL_TABLET | Freq: Every day | ORAL | 2 refills | Status: AC

## 2024-07-30 ENCOUNTER — Other Ambulatory Visit: Payer: Self-pay

## 2024-08-01 ENCOUNTER — Other Ambulatory Visit: Payer: Self-pay

## 2024-08-02 ENCOUNTER — Other Ambulatory Visit: Payer: Self-pay

## 2024-08-03 ENCOUNTER — Other Ambulatory Visit: Payer: Self-pay

## 2024-08-06 ENCOUNTER — Other Ambulatory Visit (HOSPITAL_COMMUNITY): Payer: Self-pay

## 2024-08-07 ENCOUNTER — Other Ambulatory Visit (HOSPITAL_COMMUNITY): Payer: Self-pay

## 2024-08-08 ENCOUNTER — Other Ambulatory Visit: Payer: Self-pay

## 2024-08-08 ENCOUNTER — Other Ambulatory Visit (HOSPITAL_COMMUNITY): Payer: Self-pay

## 2024-08-10 ENCOUNTER — Other Ambulatory Visit: Payer: Self-pay
